# Patient Record
Sex: Female | Born: 1970 | Race: Black or African American | Hispanic: No | Marital: Single | Smoking: Never smoker
Health system: Southern US, Community
[De-identification: ages and names within clinical notes are randomized; demographics above are authoritative.]

## PROBLEM LIST (undated history)

## (undated) DIAGNOSIS — I10 Essential (primary) hypertension: Secondary | ICD-10-CM

## (undated) DIAGNOSIS — Z8489 Family history of other specified conditions: Secondary | ICD-10-CM

## (undated) DIAGNOSIS — E785 Hyperlipidemia, unspecified: Secondary | ICD-10-CM

## (undated) DIAGNOSIS — I471 Supraventricular tachycardia, unspecified: Secondary | ICD-10-CM

## (undated) DIAGNOSIS — R112 Nausea with vomiting, unspecified: Secondary | ICD-10-CM

## (undated) DIAGNOSIS — R51 Headache: Secondary | ICD-10-CM

## (undated) DIAGNOSIS — Z8619 Personal history of other infectious and parasitic diseases: Secondary | ICD-10-CM

## (undated) DIAGNOSIS — M2540 Effusion, unspecified joint: Secondary | ICD-10-CM

## (undated) DIAGNOSIS — J302 Other seasonal allergic rhinitis: Secondary | ICD-10-CM

## (undated) DIAGNOSIS — Z9889 Other specified postprocedural states: Secondary | ICD-10-CM

## (undated) HISTORY — PX: ANKLE SURGERY: SHX546

## (undated) HISTORY — PX: VAGINAL HYSTERECTOMY: SUR661

## (undated) HISTORY — PX: CHOLECYSTECTOMY: SHX55

## (undated) HISTORY — PX: TONSILLECTOMY: SUR1361

## (undated) HISTORY — PX: TUBAL LIGATION: SHX77

## (undated) HISTORY — PX: OTHER SURGICAL HISTORY: SHX169

## (undated) HISTORY — PX: NASAL SEPTUM SURGERY: SHX37

---

## 2008-02-01 ENCOUNTER — Emergency Department (HOSPITAL_BASED_OUTPATIENT_CLINIC_OR_DEPARTMENT_OTHER): Admission: EM | Admit: 2008-02-01 | Discharge: 2008-02-01 | Payer: Self-pay | Admitting: Emergency Medicine

## 2008-07-21 ENCOUNTER — Emergency Department (HOSPITAL_BASED_OUTPATIENT_CLINIC_OR_DEPARTMENT_OTHER): Admission: EM | Admit: 2008-07-21 | Discharge: 2008-07-21 | Payer: Self-pay | Admitting: Emergency Medicine

## 2008-08-11 ENCOUNTER — Emergency Department (HOSPITAL_BASED_OUTPATIENT_CLINIC_OR_DEPARTMENT_OTHER): Admission: EM | Admit: 2008-08-11 | Discharge: 2008-08-11 | Payer: Self-pay | Admitting: Emergency Medicine

## 2009-09-10 ENCOUNTER — Ambulatory Visit: Payer: Self-pay | Admitting: Diagnostic Radiology

## 2009-09-11 ENCOUNTER — Encounter (INDEPENDENT_AMBULATORY_CARE_PROVIDER_SITE_OTHER): Payer: Self-pay | Admitting: Internal Medicine

## 2009-09-11 ENCOUNTER — Inpatient Hospital Stay (HOSPITAL_COMMUNITY): Admission: EM | Admit: 2009-09-11 | Discharge: 2009-09-13 | Payer: Self-pay | Admitting: Internal Medicine

## 2009-09-11 ENCOUNTER — Ambulatory Visit: Payer: Self-pay | Admitting: Cardiovascular Disease

## 2009-09-11 ENCOUNTER — Ambulatory Visit: Payer: Self-pay | Admitting: Surgery

## 2009-09-11 LAB — CONVERTED CEMR LAB: TSH: 0.859 microintl units/mL

## 2009-10-03 ENCOUNTER — Encounter: Payer: Self-pay | Admitting: Physician Assistant

## 2009-10-03 ENCOUNTER — Ambulatory Visit: Payer: Self-pay | Admitting: Internal Medicine

## 2009-10-03 DIAGNOSIS — N390 Urinary tract infection, site not specified: Secondary | ICD-10-CM | POA: Insufficient documentation

## 2009-10-03 DIAGNOSIS — E739 Lactose intolerance, unspecified: Secondary | ICD-10-CM | POA: Insufficient documentation

## 2009-10-03 DIAGNOSIS — D649 Anemia, unspecified: Secondary | ICD-10-CM | POA: Insufficient documentation

## 2009-10-03 DIAGNOSIS — I10 Essential (primary) hypertension: Secondary | ICD-10-CM | POA: Insufficient documentation

## 2009-10-03 DIAGNOSIS — I471 Supraventricular tachycardia: Secondary | ICD-10-CM | POA: Insufficient documentation

## 2009-10-03 DIAGNOSIS — B029 Zoster without complications: Secondary | ICD-10-CM | POA: Insufficient documentation

## 2009-10-03 DIAGNOSIS — G4730 Sleep apnea, unspecified: Secondary | ICD-10-CM | POA: Insufficient documentation

## 2009-10-03 DIAGNOSIS — R42 Dizziness and giddiness: Secondary | ICD-10-CM | POA: Insufficient documentation

## 2009-10-03 DIAGNOSIS — E876 Hypokalemia: Secondary | ICD-10-CM | POA: Insufficient documentation

## 2009-10-03 DIAGNOSIS — Z8679 Personal history of other diseases of the circulatory system: Secondary | ICD-10-CM

## 2009-10-03 LAB — CONVERTED CEMR LAB
Glucose, Urine, Semiquant: NEGATIVE
Ketones, urine, test strip: NEGATIVE
Protein, U semiquant: 30
WBC Urine, dipstick: NEGATIVE

## 2009-10-04 DIAGNOSIS — E785 Hyperlipidemia, unspecified: Secondary | ICD-10-CM | POA: Insufficient documentation

## 2009-10-04 LAB — CONVERTED CEMR LAB
ALT: 18 units/L (ref 0–35)
Albumin: 4.5 g/dL (ref 3.5–5.2)
Basophils Absolute: 0 10*3/uL (ref 0.0–0.1)
CO2: 27 meq/L (ref 19–32)
Calcium: 9.5 mg/dL (ref 8.4–10.5)
Chloride: 101 meq/L (ref 96–112)
Cholesterol: 251 mg/dL — ABNORMAL HIGH (ref 0–200)
Hemoglobin: 12.6 g/dL (ref 12.0–15.0)
Lymphocytes Relative: 43 % (ref 12–46)
Monocytes Absolute: 0.3 10*3/uL (ref 0.1–1.0)
Neutro Abs: 3.3 10*3/uL (ref 1.7–7.7)
Platelets: 349 10*3/uL (ref 150–400)
RDW: 12.9 % (ref 11.5–15.5)
Sodium: 139 meq/L (ref 135–145)
Total Protein: 7.2 g/dL (ref 6.0–8.3)

## 2009-10-09 ENCOUNTER — Encounter (INDEPENDENT_AMBULATORY_CARE_PROVIDER_SITE_OTHER): Payer: Self-pay | Admitting: *Deleted

## 2010-02-04 NOTE — Letter (Signed)
Summary: *HSN Results Follow up  [facility]  [address] [place], [place] [address]   Phone: [phone]  Fax: [phone]      10/09/2009   Cassidy Sanchez [address] [place], [place]  [address]   Dear  Ms. Clifford Hebel,                            ____S.Drinkard,FNP   ____D. Gore,FNP       ____B. McPherson,MD   ____V. Rankins,MD    ____E. Mulberry,MD    ____N. Daphine Deutscher, FNP  ____D. Reche Dixon, MD    ____K. Philipp Deputy, MD    ____Other     This letter is to inform you that your recent test(s):  _______Pap Smear    ___X____Lab Test     _______X-ray    _______ is within acceptable limits  ___X____ requires a medication change  ____X___ requires a follow-up lab visit  _______ requires a follow-up visit with your provider   Comments:  We have been trying to reach you.  Please call the office at your earliest convenience.       _________________________________________________________ If you have any questions, please contact our office                     Sincerely,  Armenia Shannon [facility]

## 2010-02-04 NOTE — Assessment & Plan Note (Signed)
Summary: XFU--SYNCOPE//YC   Vital Signs:  Patient profile:   40 year old female Height:      63.5 inches Weight:      232 pounds BMI:     40.60 Temp:     97.8 degrees F oral Pulse rate:   76 / minute Pulse rhythm:   regular Resp:     18 per minute BP sitting:   140 / 86  (left arm) Cuff size:   regular  Vitals Entered By: [place] Shannon (October 03, 2009 11:52 AM) CC: xf/u.... pt says her left ear feels real funny and thats when she starts feeling dizzy.. pt says she has a cold   Does patient need assistance? Functional Status Self care Ambulation Normal   Primary Care Iria Jamerson:  Tereso Newcomer, PA-C  CC:  xf/u.... pt says her left ear feels real funny and thats when she starts feeling dizzy.. pt says she has a cold .  History of Present Illness: New pt.  Moved to [place] 2 years ago.  Previously followed by Dr. Lerry Liner.  She was just admitted to the hospital with episodes of near syncope.  She was diagnosed with urinary tract infection.  She had a reported history of supraventricular tachycardia.  She was evaluated by cardiology.  An echocardiogram demonstrated normal LV function.  Chest CT was negative.  Head CT was also negative.  An outpatient event monitor was recommended.  Of note, her orthostatic vital signs were normal.  She states that she had 2 episodes of near syncope when going from sitting to standing.  She felt some rapid heart rates with this.  She denies any frank syncope.  She's had some episodes of chest discomfort and shortness of breath.  No further workup was felt necessary by cardiology.  She denies exertional chest pain or shortness of breath.  She denies any spinning sensation.  She can sometimes make her somewhat dizzy with turning over in bed at night.  She said couple of episodes of lightheadedness since being discharged from hospital.  She's not had symptoms like what she had prior to being admitted to the hospital.  She also developed some blisters on  the tip of her nose about 2 days ago.  It is somewhat painful.  She says that she first had a cold sore.  She's never had shingles.  Habits & Providers  Alcohol-Tobacco-Diet     Tobacco Status: never  Exercise-Depression-Behavior     Drug Use: no  Current Medications (verified): 1)  None  Allergies (verified): No Known Drug Allergies  Past History:  Past Medical History: Admx for syncope/near syncope 09/2009    a.  Echo 09/11/2009:  EF 55-60%    b.  Chest CT neg for PE    c.  EEG normal Glucose Intolerance (A1C 6.2 in 09/2009) Hypertension Sleep Apnea - s/p surgery (no cpap) h/o Parox. SVT   a.  prior PCP placed on meds for rapid heartbeat in past; no formal workup   b.  2 week event monitor completed 10/02/2009 with [place]  Past Surgical History: Hysterectomy (TAH only) Left Breast Lumpectomy Tubal ligation s/p uvulectomy s/p sinus surgery  Family History: Lung CA - dad Breast CA - PGM  Social History: Occupation: Clinical biochemist Married- separated 2 kids Never Smoked Alcohol use-no Drug use-no Occupation:  employed Smoking Status:  never Drug Use:  no  Physical Exam  General:  alert, well-developed, and well-nourished.   Head:  normocephalic and atraumatic.   Eyes:  pupils equal, pupils round, and pupils reactive to light.   Ears:  R ear normal and L ear normal.   Mouth:  pharynx pink and moist.   Neck:  supple.   Lungs:  normal breath sounds.   Heart:  normal rate, regular rhythm, and no murmur.   Abdomen:  soft, non-tender, and no hepatomegaly.   Neurologic:  alert & oriented X3, cranial nerves II-XII intact, strength normal in all extremities, DTRs symmetrical and normal, and Romberg negative.   No dizziness elicited with side lying bilat Skin:  large patch of excoriated vesicle tip of nose Psych:  normally interactive.     Impression & Recommendations:  Problem # 1:  ANEMIA (ICD-285.9)  repeat cbc  Orders: T-CBC w/Diff  (04540-98119)  Problem # 2:  HYPOKALEMIA (ICD-276.8)  noted in hosp 09/2009 get repeat cmet  Orders: T-Comprehensive Metabolic Panel (14782-95621)  Problem # 3:  DIZZINESS (ICD-780.4) extensive w/u near syncope happened in context of UTI ? related to volume depletion  event monitor pending  ? sinus congestion contributing to lightheadedness trial of nasal steroid consider vestib rehab if symptoms continue (although no classic symptoms of vertigo)  Problem # 4:  HERPES ZOSTER (ICD-053.9) on tip of nose rec tx as she is within 48 hours of starting  Problem # 5:  GLUCOSE INTOLERANCE (ICD-271.3)  refer to Susie Piper  Orders: T-Lipid Profile (30865-78469)  Problem # 6:  UTI (ICD-599.0) repeat u/a to make sure recovered  Orders: T-Urinalysis (62952-84132)  Problem # 7:  SUPRAVENTRICULAR TACHYCARDIA, HX OF (ICD-V12.59) monitor pending  Problem # 8:  Preventive Health Care (ICD-V70.0)  due for pap in 1-2 months get records  Orders: T-Lipid Profile (44010-27253)  Complete Medication List: 1)  Metoprolol Tartrate 50 Mg Tabs (Metoprolol tartrate) .... Take 1 tablet by mouth two times a day 2)  Hydrochlorothiazide 25 Mg Tabs (Hydrochlorothiazide) .... Take 1 tablet by mouth once a day 3)  Amitriptyline Hcl 75 Mg Tabs (Amitriptyline hcl) .... Take 1 tab by mouth at bedtime as needed for sleep 4)  Nasacort Aq 55 Mcg/act Aers (Triamcinolone acetonide) .... 2 sprays each nostril once daily 5)  Valtrex 1 Gm Tabs (Valacyclovir hcl) .... Take 1 tablet by mouth three times a day for 7 days  Patient Instructions: 1)  Need eligibility appt. 2)  Refer to [employer] for diet (glucose intolerance). 3)  Sign form to get records from prior PCP (Dr. Mayford Knife). 4)  Use Nasacort once daily for one month.  You can use as needed after that. 5)  Take Valtrex until all gone. 6)  If your lesions on your nose are not getting better or are getting worse in the next week, you need to follow  up. 7)  Otherwise, schedule an appt in 2 months for CPP. Prescriptions: VALTREX 1 GM TABS (VALACYCLOVIR HCL) Take 1 tablet by mouth three times a day for 7 days  #21 x 0   Entered and Authorized by:   Tereso Newcomer PA-C   Signed by:   Tereso Newcomer PA-C on 10/03/2009   Method used:   Printed then faxed to ...         RxID:   [id number] NASACORT AQ 55 MCG/ACT AERS (TRIAMCINOLONE ACETONIDE) 2 sprays each nostril once daily  #1 x 2   Entered and Authorized by:   Tereso Newcomer PA-C   Signed by:   Tereso Newcomer PA-C on 10/03/2009   Method used:   Printed then faxed to .[facility] Kitchen[facility]  RxID:   [id number]    CT of Chest  Procedure date:  09/12/2009  Findings:      IMPRESSION:   Negative.  No evidence of pulmonary embolism or other acute   findings.  CT Brain  Procedure date:  09/10/2009  Findings:       Findings: The brain appears normal without evidence of acute   infarction, hemorrhage, mass lesion, mass effect, midline shift or   abnormal extra-axial fluid collection.  No hydrocephalus.   Calvarium intact.    IMPRESSION:   Normal head CT.  Laboratory Results   Urine Tests  Date/Time Received: October 03, 2009 Date/Time Reported: 3:00 PM  Routine Urinalysis   Color: yellow Appearance: Clear Glucose: negative   (Normal Range: Negative) Bilirubin: negative   (Normal Range: Negative) Ketone: negative   (Normal Range: Negative) Spec. Gravity: >=1.030   (Normal Range: 1.003-1.035) Blood: negative   (Normal Range: Negative) pH: 6.0   (Normal Range: 5.0-8.0) Protein: 30   (Normal Range: Negative) Urobilinogen: 0.2   (Normal Range: 0-1) Nitrite: negative   (Normal Range: Negative) Leukocyte Esterace: negative   (Normal Range: Negative)

## 2010-03-20 LAB — TSH: TSH: 0.859 u[IU]/mL (ref 0.350–4.500)

## 2010-03-20 LAB — URINE CULTURE
Colony Count: 30000
Culture  Setup Time: 201109070422

## 2010-03-20 LAB — COMPREHENSIVE METABOLIC PANEL
AST: 17 U/L (ref 0–37)
Albumin: 3.3 g/dL — ABNORMAL LOW (ref 3.5–5.2)
Alkaline Phosphatase: 51 U/L (ref 39–117)
Alkaline Phosphatase: 34 U/L — ABNORMAL LOW (ref 39–117)
BUN: 18 mg/dL (ref 6–23)
BUN: 9 mg/dL (ref 6–23)
CO2: 29 mEq/L (ref 19–32)
Chloride: 101 mEq/L (ref 96–112)
Creatinine, Ser: 0.8 mg/dL (ref 0.4–1.2)
GFR calc non Af Amer: >60 mL/min (ref >60)
Potassium: 3.2 mEq/L — ABNORMAL LOW (ref 3.5–5.1)
Total Bilirubin: 0.5 mg/dL (ref 0.3–1.2)
Total Protein: 6.1 g/dL (ref 6.0–8.3)

## 2010-03-20 LAB — CARDIAC PANEL(CRET KIN+CKTOT+MB+TROPI)
CK, MB: 1.2 ng/mL (ref 0.3–4.0)
Relative Index: 1 (ref 0.0–2.5)
Relative Index: 1.2 (ref 0.0–2.5)
Total CK: 102 U/L (ref 7–177)
Troponin I: 0.01 ng/mL (ref 0.00–0.06)

## 2010-03-20 LAB — URINALYSIS, ROUTINE W REFLEX MICROSCOPIC
Glucose, UA: NEGATIVE mg/dL
Specific Gravity, Urine: 1.029 (ref 1.005–1.030)
pH: 6 (ref 5.0–8.0)

## 2010-03-20 LAB — BASIC METABOLIC PANEL
BUN: 8 mg/dL (ref 6–23)
CO2: 26 mEq/L (ref 19–32)
Chloride: 106 mEq/L (ref 96–112)
Creatinine, Ser: 0.64 mg/dL (ref 0.4–1.2)
GFR calc non Af Amer: >60 mL/min (ref >60)
Potassium: 3.4 mEq/L — ABNORMAL LOW (ref 3.5–5.1)
Sodium: 138 mEq/L (ref 135–145)

## 2010-03-20 LAB — CBC
Hemoglobin: 12.5 g/dL (ref 12.0–15.0)
MCH: 32.1 pg (ref 26.0–34.0)
MCV: 90.9 fL (ref 78.0–100.0)
MCV: 89.9 fL (ref 78.0–100.0)
Platelets: 312 10*3/uL (ref 150–400)
RBC: 3.9 MIL/uL (ref 3.87–5.11)
RDW: 12.3 % (ref 11.5–15.5)
WBC: 7.2 10*3/uL (ref 4.0–10.5)

## 2010-03-20 LAB — DIFFERENTIAL
Eosinophils Absolute: 0.1 10*3/uL (ref 0.0–0.7)
Eosinophils Relative: 1 % (ref 0–5)
Lymphs Abs: 4 10*3/uL (ref 0.7–4.0)
Monocytes Relative: 7 % (ref 3–12)

## 2010-03-20 LAB — HEMOGLOBIN A1C: Mean Plasma Glucose: 131 mg/dL — ABNORMAL HIGH (ref <117)

## 2010-03-20 LAB — POCT CARDIAC MARKERS

## 2010-03-20 LAB — MAGNESIUM: Magnesium: 1.9 mg/dL (ref 1.5–2.5)

## 2010-04-12 ENCOUNTER — Emergency Department (HOSPITAL_BASED_OUTPATIENT_CLINIC_OR_DEPARTMENT_OTHER)
Admission: EM | Admit: 2010-04-12 | Discharge: 2010-04-12 | Disposition: A | Discharge status: Home / Self Care | Payer: Self-pay | Attending: Emergency Medicine | Admitting: Emergency Medicine

## 2010-04-12 DIAGNOSIS — L089 Local infection of the skin and subcutaneous tissue, unspecified: Secondary | ICD-10-CM | POA: Insufficient documentation

## 2010-04-12 DIAGNOSIS — M79609 Pain in unspecified limb: Secondary | ICD-10-CM | POA: Insufficient documentation

## 2010-04-12 DIAGNOSIS — I10 Essential (primary) hypertension: Secondary | ICD-10-CM | POA: Insufficient documentation

## 2010-04-12 DIAGNOSIS — E785 Hyperlipidemia, unspecified: Secondary | ICD-10-CM | POA: Insufficient documentation

## 2010-12-16 ENCOUNTER — Emergency Department (INDEPENDENT_AMBULATORY_CARE_PROVIDER_SITE_OTHER)

## 2010-12-16 ENCOUNTER — Emergency Department (HOSPITAL_BASED_OUTPATIENT_CLINIC_OR_DEPARTMENT_OTHER)
Admission: EM | Admit: 2010-12-16 | Discharge: 2010-12-16 | Disposition: A | Discharge status: Home / Self Care | Attending: Emergency Medicine | Admitting: Emergency Medicine

## 2010-12-16 ENCOUNTER — Encounter: Payer: Self-pay | Admitting: *Deleted

## 2010-12-16 DIAGNOSIS — S9000XA Contusion of unspecified ankle, initial encounter: Secondary | ICD-10-CM | POA: Insufficient documentation

## 2010-12-16 DIAGNOSIS — M79609 Pain in unspecified limb: Secondary | ICD-10-CM

## 2010-12-16 DIAGNOSIS — S9001XA Contusion of right ankle, initial encounter: Secondary | ICD-10-CM

## 2010-12-16 DIAGNOSIS — W208XXA Other cause of strike by thrown, projected or falling object, initial encounter: Secondary | ICD-10-CM | POA: Insufficient documentation

## 2010-12-16 DIAGNOSIS — X58XXXA Exposure to other specified factors, initial encounter: Secondary | ICD-10-CM

## 2010-12-16 DIAGNOSIS — R609 Edema, unspecified: Secondary | ICD-10-CM | POA: Insufficient documentation

## 2010-12-16 HISTORY — DX: Supraventricular tachycardia, unspecified: I47.10

## 2010-12-16 HISTORY — DX: Supraventricular tachycardia: I47.1

## 2010-12-16 MED ORDER — OXYCODONE-ACETAMINOPHEN 5-325 MG PO TABS
1.0000 | ORAL_TABLET | Freq: Once | ORAL | Status: AC
  Administered 2010-12-16: 1 via ORAL
  Filled 2010-12-16: qty 1

## 2010-12-16 MED ORDER — NAPROXEN 500 MG PO TABS: 500.0000 mg | ORAL_TABLET | Freq: Two times a day (BID) | ORAL | Status: DC

## 2010-12-16 NOTE — ED Notes (Signed)
C/o right foot pain after pallet fell on it at work, +PMS

## 2010-12-16 NOTE — ED Provider Notes (Signed)
History     CSN: [id number] Arrival date & time: 12/16/2010  9:35 PM   First MD Initiated Contact with Patient 12/16/10 2231      Chief Complaint  Patient presents with  . Foot Injury    (Consider location/radiation/quality/duration/timing/severity/associated sxs/prior treatment) HPI Patient was at work today when she had a pallet fall on her right foot. Patient complains of pain in the right ankle area.  The pain increases with movement and palpation. She denies any other injuries. The pain is sharp and moderate. Past Medical History  Diagnosis Date  . SVT (supraventricular tachycardia)     Past Surgical History  Procedure Date  . Tubal ligation     No family history on file.  History  Substance Use Topics  . Smoking status: Not on file  . Smokeless tobacco: Not on file  . Alcohol Use: No    OB History    Grav Para Term Preterm Abortions TAB SAB Ect Mult Living                  Review of Systems  All other systems reviewed and are negative.    Allergies  Sulfa antibiotics  Home Medications   Current Outpatient Rx  Name Route Sig Dispense Refill  . HYDROCHLOROTHIAZIDE 25 MG PO TABS Oral Take 25 mg by mouth daily.      Marland Kitchen METOPROLOL TARTRATE 50 MG PO TABS Oral Take 50 mg by mouth 2 (two) times daily.      Marland Kitchen ONE-DAILY MULTI VITAMINS PO TABS Oral Take 1 tablet by mouth daily.        BP 179/97  Pulse 96  Temp(Src) 98.7 F (37.1 C) (Oral)  Resp 18  SpO2 100%  Physical Exam  Nursing note and vitals reviewed. Constitutional: She appears well-developed and well-nourished. No distress.  HENT:  Head: Normocephalic and atraumatic.  Right Ear: External ear normal.  Left Ear: External ear normal.  Eyes: Conjunctivae are normal. Right eye exhibits no discharge. Left eye exhibits no discharge. No scleral icterus.  Neck: Neck supple. No tracheal deviation present.  Cardiovascular: Normal rate.   Pulmonary/Chest: Effort normal. No stridor. No respiratory  distress.  Musculoskeletal: She exhibits edema and tenderness.       Right ankle: She exhibits swelling and ecchymosis. She exhibits no deformity and normal pulse. tenderness. Achilles tendon normal.  Neurological: She is alert. Cranial nerve deficit: no gross deficits.  Skin: Skin is warm and dry. No rash noted.  Psychiatric: She has a normal mood and affect.    ED Course  Procedures (including critical care time)  Labs Reviewed - No data to display Dg Foot Complete Right  12/16/2010  *RADIOLOGY REPORT*  Clinical Data: Anterior foot pain.  RIGHT FOOT COMPLETE - 3+ VIEW  Comparison: None.  Findings: No acute bony abnormality.  Specifically, no fracture, subluxation, or dislocation.  Soft tissues are intact.  IMPRESSION: No acute bony abnormality.  Original Report Authenticated By: Cyndie Chime, M.D.     Diagnosis: Contusion of right ankle   MDM  Patient without signs of fracture. SHe was placed in a splint and given crutches.        Celene Kras, MD 12/16/10 [phone]

## 2011-01-09 ENCOUNTER — Ambulatory Visit: Payer: Worker's Compensation

## 2011-01-09 ENCOUNTER — Other Ambulatory Visit: Payer: Self-pay | Admitting: Occupational Medicine

## 2011-01-09 DIAGNOSIS — R52 Pain, unspecified: Secondary | ICD-10-CM

## 2011-02-20 ENCOUNTER — Other Ambulatory Visit: Payer: Self-pay | Admitting: Orthopedic Surgery

## 2011-02-20 DIAGNOSIS — M79671 Pain in right foot: Secondary | ICD-10-CM

## 2011-02-20 DIAGNOSIS — M25571 Pain in right ankle and joints of right foot: Secondary | ICD-10-CM

## 2011-02-23 ENCOUNTER — Other Ambulatory Visit: Payer: Self-pay

## 2011-02-25 ENCOUNTER — Ambulatory Visit
Admission: RE | Admit: 2011-02-25 | Discharge: 2011-02-25 | Disposition: A | Discharge status: Home / Self Care | Payer: Worker's Compensation | Source: Ambulatory Visit | Attending: Orthopedic Surgery | Admitting: Orthopedic Surgery

## 2011-02-25 DIAGNOSIS — M25571 Pain in right ankle and joints of right foot: Secondary | ICD-10-CM

## 2011-02-25 DIAGNOSIS — M79671 Pain in right foot: Secondary | ICD-10-CM

## 2011-03-17 ENCOUNTER — Encounter (HOSPITAL_COMMUNITY): Payer: Self-pay

## 2011-03-17 ENCOUNTER — Emergency Department (HOSPITAL_COMMUNITY)
Admission: EM | Admit: 2011-03-17 | Discharge: 2011-03-17 | Disposition: A | Discharge status: Home Health | Payer: Self-pay | Attending: Emergency Medicine | Admitting: Emergency Medicine

## 2011-03-17 DIAGNOSIS — I10 Essential (primary) hypertension: Secondary | ICD-10-CM | POA: Insufficient documentation

## 2011-03-17 DIAGNOSIS — R197 Diarrhea, unspecified: Secondary | ICD-10-CM | POA: Insufficient documentation

## 2011-03-17 DIAGNOSIS — R109 Unspecified abdominal pain: Secondary | ICD-10-CM | POA: Insufficient documentation

## 2011-03-17 DIAGNOSIS — K529 Noninfective gastroenteritis and colitis, unspecified: Secondary | ICD-10-CM

## 2011-03-17 DIAGNOSIS — R112 Nausea with vomiting, unspecified: Secondary | ICD-10-CM | POA: Insufficient documentation

## 2011-03-17 DIAGNOSIS — K5289 Other specified noninfective gastroenteritis and colitis: Secondary | ICD-10-CM | POA: Insufficient documentation

## 2011-03-17 DIAGNOSIS — R6883 Chills (without fever): Secondary | ICD-10-CM | POA: Insufficient documentation

## 2011-03-17 HISTORY — DX: Essential (primary) hypertension: I10

## 2011-03-17 MED ORDER — GI COCKTAIL ~~LOC~~
30.0000 mL | Freq: Once | ORAL | Status: AC
  Administered 2011-03-17: 30 mL via ORAL
  Filled 2011-03-17: qty 30

## 2011-03-17 MED ORDER — ONDANSETRON HCL 4 MG PO TABS: 4.0000 mg | ORAL_TABLET | Freq: Four times a day (QID) | ORAL | Status: AC

## 2011-03-17 MED ORDER — MORPHINE SULFATE 4 MG/ML IJ SOLN
4.0000 mg | Freq: Once | INTRAMUSCULAR | Status: AC
  Administered 2011-03-17: 4 mg via INTRAVENOUS
  Filled 2011-03-17: qty 1

## 2011-03-17 MED ORDER — ONDANSETRON HCL 4 MG/2ML IJ SOLN
4.0000 mg | Freq: Once | INTRAMUSCULAR | Status: AC
  Administered 2011-03-17: 4 mg via INTRAVENOUS
  Filled 2011-03-17: qty 2

## 2011-03-17 MED ORDER — SODIUM CHLORIDE 0.9 % IV BOLUS (SEPSIS)
1000.0000 mL | Freq: Once | INTRAVENOUS | Status: AC
  Administered 2011-03-17: 1000 mL via INTRAVENOUS

## 2011-03-17 MED ORDER — ONDANSETRON HCL 4 MG/2ML IJ SOLN
INTRAMUSCULAR | Status: AC
  Administered 2011-03-17: 10:00:00
  Filled 2011-03-17: qty 2

## 2011-03-17 MED ORDER — LORAZEPAM 2 MG/ML IJ SOLN
1.0000 mg | Freq: Once | INTRAMUSCULAR | Status: AC
  Administered 2011-03-17: 1 mg via INTRAVENOUS
  Filled 2011-03-17: qty 1

## 2011-03-17 NOTE — ED Notes (Signed)
EMS reports pt with nausea and vomiting and diarrhea since 4am, IV zofran given 500 cc NS thru 18 GU IV

## 2011-03-17 NOTE — ED Provider Notes (Signed)
Medical screening examination/treatment/procedure(s) were performed by non-physician practitioner and as supervising physician I was immediately available for consultation/collaboration. Devoria Albe, MD, Armando Gang   Ward Givens, MD 03/17/11 [phone]

## 2011-03-17 NOTE — ED Notes (Signed)
Report received and care assumed from Nolon Lennert., RN

## 2011-03-17 NOTE — Discharge Instructions (Signed)
Continue to stay well hydrated with small sips of fluid throughout the day. Use Zofran as directed, as needed for nausea. Follow a BRAT diet for next 24-48 hours. Followup with Dr. Christianne Borrow in the next 2-3 days for recheck of ongoing symptoms but returned to emergency department for changing or worsening of symptoms.  Viral Gastroenteritis Viral gastroenteritis is also known as stomach flu. This condition affects the stomach and intestinal tract. It can cause sudden diarrhea and vomiting. The illness typically lasts 3 to 8 days. Most people develop an immune response that eventually gets rid of the virus. While this natural response develops, the virus can make you quite ill. CAUSES  Many different viruses can cause gastroenteritis, such as rotavirus or noroviruses. You can catch one of these viruses by consuming contaminated food or water. You may also catch a virus by sharing utensils or other personal items with an infected person or by touching a contaminated surface. SYMPTOMS  The most common symptoms are diarrhea and vomiting. These problems can cause a severe loss of body fluids (dehydration) and a body salt (electrolyte) imbalance. Other symptoms may include:  Fever.   Headache.   Fatigue.   Abdominal pain.  DIAGNOSIS  Your caregiver can usually diagnose viral gastroenteritis based on your symptoms and a physical exam. A stool sample may also be taken to test for the presence of viruses or other infections. TREATMENT  This illness typically goes away on its own. Treatments are aimed at rehydration. The most serious cases of viral gastroenteritis involve vomiting so severely that you are not able to keep fluids down. In these cases, fluids must be given through an intravenous line (IV). HOME CARE INSTRUCTIONS   Drink enough fluids to keep your urine clear or pale yellow. Drink small amounts of fluids frequently and increase the amounts as tolerated.   Ask your caregiver for specific  rehydration instructions.   Avoid:   Foods high in sugar.   Alcohol.   Carbonated drinks.   Tobacco.   Juice.   Caffeine drinks.   Extremely hot or cold fluids.   Fatty, greasy foods.   Too much intake of anything at one time.   Dairy products until 24 to 48 hours after diarrhea stops.   You may consume probiotics. Probiotics are active cultures of beneficial bacteria. They may lessen the amount and number of diarrheal stools in adults. Probiotics can be found in yogurt with active cultures and in supplements.   Wash your hands well to avoid spreading the virus.   Only take over-the-counter or prescription medicines for pain, discomfort, or fever as directed by your caregiver. Do not give aspirin to children. Antidiarrheal medicines are not recommended.   Ask your caregiver if you should continue to take your regular prescribed and over-the-counter medicines.   Keep all follow-up appointments as directed by your caregiver.  SEEK IMMEDIATE MEDICAL CARE IF:   You are unable to keep fluids down.   You do not urinate at least once every 6 to 8 hours.   You develop shortness of breath.   You notice blood in your stool or vomit. This may look like coffee grounds.   You have abdominal pain that increases or is concentrated in one small area (localized).   You have persistent vomiting or diarrhea.   You have a fever.   The patient is a child younger than 3 months, and he or she has a fever.   The patient is a child older than 3  months, and he or she has a fever and persistent symptoms.   The patient is a child older than 3 months, and he or she has a fever and symptoms suddenly get worse.   The patient is a baby, and he or she has no tears when crying.  MAKE SURE YOU:   Understand these instructions.   Will watch your condition.   Will get help right away if you are not doing well or get worse.  Document Released: 12/22/2004 Document Revised: 12/11/2010 Document  Reviewed: 10/08/2010 [facility] Patient Information 2012 [place], [place].  B.R.A.T. Diet Your doctor has recommended the B.R.A.T. diet for you or your child until the condition improves. This is often used to help control diarrhea and vomiting symptoms. If you or your child can tolerate clear liquids, you may have:  Bananas.   Rice.   Applesauce.   Toast (and other simple starches such as crackers, potatoes, noodles).  Be sure to avoid dairy products, meats, and fatty foods until symptoms are better. Fruit juices such as apple, grape, and prune juice can make diarrhea worse. Avoid these. Continue this diet for 2 days or as instructed by your caregiver. Document Released: 12/22/2004 Document Revised: 12/11/2010 Document Reviewed: 06/10/2006 [facility] Patient Information 2012 [place], [place]. B.R.A.T. Diet Your doctor has recommended the B.R.A.T. diet for you or your child until the condition improves. This is often used to help control diarrhea and vomiting symptoms. If you or your child can tolerate clear liquids, you may have:  Bananas.   Rice.   Applesauce.   Toast (and other simple starches such as crackers, potatoes, noodles).  Be sure to avoid dairy products, meats, and fatty foods until symptoms are better. Fruit juices such as apple, grape, and prune juice can make diarrhea worse. Avoid these. Continue this diet for 2 days or as instructed by your caregiver. Document Released: 12/22/2004 Document Revised: 12/11/2010 Document Reviewed: 06/10/2006 [facility] Patient Information 2012 [place], [place].

## 2011-03-17 NOTE — ED Notes (Signed)
Pt denies abdominal pain at this time.  She does report some mild nausea without emesis that she says was worsened by the GI cocktail.  She does c/o back pain at 9/10.  Dr. Lynelle Doctor notified.

## 2011-03-17 NOTE — ED Provider Notes (Signed)
History     CSN: [id number]  Arrival date & time 03/17/11  4098   First MD Initiated Contact with Patient 03/17/11 [phone]      Chief Complaint  Patient presents with  . Emesis    (Consider location/radiation/quality/duration/timing/severity/associated sxs/prior treatment) HPI  Presents to emergency department complaining of acute onset nausea, vomiting, diarrhea and chills. Patient states that 4 AM she woke from her  sleep with acute onset nausea and vomiting that was followed by diarrhea. Patient states that since onset of symptoms she's had persistent vomiting and loose stools times multiple episodes. Patient states she's not been able to tolerate fluids by mouth due to ongoing vomiting. Patient states she's felt chilled and feverish but did not have known recorded fever. The patient denies any recent sick contacts. Patient states that she has abdominal cramping with the vomiting and diarrhea but denies any point specific abdominal pain. She denies aggravating or alleviating factors. It is known fevers, headache, chest pain, shortness breath, point specific abdominal pain, dysuria, hematuria, or blood in her stool.  Past Medical History  Diagnosis Date  . SVT (supraventricular tachycardia)   . Hypertension     Past Surgical History  Procedure Date  . Tubal ligation   . Cesarean section   . Abdominal hysterectomy     History reviewed. No pertinent family history.  History  Substance Use Topics  . Smoking status: Not on file  . Smokeless tobacco: Not on file  . Alcohol Use: No    OB History    Grav Para Term Preterm Abortions TAB SAB Ect Mult Living   2 2        2       Review of Systems  All other systems reviewed and are negative.    Allergies  Latex and Sulfa antibiotics  Home Medications   Current Outpatient Rx  Name Route Sig Dispense Refill  . HYDROCHLOROTHIAZIDE 25 MG PO TABS Oral Take 25 mg by mouth every evening.     Marland Kitchen METOPROLOL TARTRATE 50 MG PO  TABS Oral Take 50 mg by mouth 2 (two) times daily.        BP 150/86  Pulse 113  Temp(Src) 99.5 F (37.5 C) (Oral)  Resp 22  SpO2 95%  Physical Exam  Nursing note and vitals reviewed. Constitutional: She is oriented to person, place, and time. She appears well-developed and well-nourished. No distress.  HENT:  Head: Normocephalic and atraumatic.  Eyes: Conjunctivae are normal.  Neck: Normal range of motion. Neck supple.  Cardiovascular: Regular rhythm, normal heart sounds and intact distal pulses.  Tachycardia present.  Exam reveals no gallop and no friction rub.   No murmur heard. Pulmonary/Chest: Effort normal and breath sounds normal. No respiratory distress. She has no wheezes. She has no rales. She exhibits no tenderness.  Abdominal: Soft. Bowel sounds are normal. She exhibits no distension and no mass. There is no tenderness. There is no rebound and no guarding.  Musculoskeletal: Normal range of motion. She exhibits no edema and no tenderness.  Neurological: She is alert and oriented to person, place, and time.  Skin: Skin is warm and dry. No rash noted. She is not diaphoretic. No erythema.  Psychiatric: She has a normal mood and affect.    ED Course  Procedures (including critical care time)  IV fluids, IV zofran. PO GI cocktail.   Patient is tolerating fluids well in ER, drinking sprite and eating saltine crackers without vomiting or difficulty.   Labs Reviewed -  No data to display No results found.   1. Gastroenteritis       MDM  N/v/d with mild abdominal cramping but no point specific abdominal pain with abdomen soft no peritoneal signs consistent with a gastroenteritis at this time. Patient has no signs or symptoms of small bowel traction or other acute process in abdomen. Patient afebrile nontoxic-appearing. She is tolerating fluids well in ER. Patient is agreeable to following up with her primary care physician next few days for recheck of ongoing mild symptoms  but to return to emergency department for changing or worsening symptoms.        Jenness Corner, [place] 03/17/11 1246

## 2011-03-17 NOTE — ED Notes (Addendum)
Pt tolerated sprite and crackers well.  No c/o N/V/D at this time.

## 2013-02-16 ENCOUNTER — Emergency Department (HOSPITAL_BASED_OUTPATIENT_CLINIC_OR_DEPARTMENT_OTHER): Payer: No Typology Code available for payment source

## 2013-02-16 ENCOUNTER — Emergency Department (HOSPITAL_BASED_OUTPATIENT_CLINIC_OR_DEPARTMENT_OTHER)
Admission: EM | Admit: 2013-02-16 | Discharge: 2013-02-17 | Disposition: A | Discharge status: Home / Self Care | Payer: No Typology Code available for payment source | Attending: Emergency Medicine | Admitting: Emergency Medicine

## 2013-02-16 ENCOUNTER — Encounter (HOSPITAL_BASED_OUTPATIENT_CLINIC_OR_DEPARTMENT_OTHER): Payer: Self-pay | Admitting: Emergency Medicine

## 2013-02-16 DIAGNOSIS — I10 Essential (primary) hypertension: Secondary | ICD-10-CM | POA: Insufficient documentation

## 2013-02-16 DIAGNOSIS — Z91040 Latex allergy status: Secondary | ICD-10-CM | POA: Insufficient documentation

## 2013-02-16 DIAGNOSIS — Z3202 Encounter for pregnancy test, result negative: Secondary | ICD-10-CM | POA: Insufficient documentation

## 2013-02-16 DIAGNOSIS — Z79899 Other long term (current) drug therapy: Secondary | ICD-10-CM | POA: Insufficient documentation

## 2013-02-16 DIAGNOSIS — Z9851 Tubal ligation status: Secondary | ICD-10-CM | POA: Insufficient documentation

## 2013-02-16 DIAGNOSIS — Z90710 Acquired absence of both cervix and uterus: Secondary | ICD-10-CM | POA: Insufficient documentation

## 2013-02-16 DIAGNOSIS — R110 Nausea: Secondary | ICD-10-CM | POA: Insufficient documentation

## 2013-02-16 DIAGNOSIS — R1011 Right upper quadrant pain: Secondary | ICD-10-CM | POA: Insufficient documentation

## 2013-02-16 LAB — COMPREHENSIVE METABOLIC PANEL
ALT: 15 U/L (ref 0–35)
AST: 14 U/L (ref 0–37)
Albumin: 4.2 g/dL (ref 3.5–5.2)
Alkaline Phosphatase: 75 U/L (ref 39–117)
BUN: 13 mg/dL (ref 6–23)
CHLORIDE: 94 meq/L — AB (ref 96–112)
CO2: 30 meq/L (ref 19–32)
CREATININE: 0.6 mg/dL (ref 0.50–1.10)
Calcium: 10 mg/dL (ref 8.4–10.5)
GLUCOSE: 106 mg/dL — AB (ref 70–99)
Potassium: 3.7 mEq/L (ref 3.7–5.3)
Sodium: 137 mEq/L (ref 137–147)
Total Bilirubin: 0.3 mg/dL (ref 0.3–1.2)
Total Protein: 8.3 g/dL (ref 6.0–8.3)

## 2013-02-16 LAB — CBC WITH DIFFERENTIAL/PLATELET
BASOS ABS: 0 10*3/uL (ref 0.0–0.1)
Basophils Relative: 0 % (ref 0–1)
Eosinophils Absolute: 0.2 10*3/uL (ref 0.0–0.7)
Eosinophils Relative: 2 % (ref 0–5)
HEMATOCRIT: 42.1 % (ref 36.0–46.0)
HEMOGLOBIN: 14.1 g/dL (ref 12.0–15.0)
LYMPHS ABS: 4.4 10*3/uL — AB (ref 0.7–4.0)
LYMPHS PCT: 46 % (ref 12–46)
MCH: 29.6 pg (ref 26.0–34.0)
MCHC: 33.5 g/dL (ref 30.0–36.0)
MCV: 88.4 fL (ref 78.0–100.0)
MONO ABS: 0.6 10*3/uL (ref 0.1–1.0)
Monocytes Relative: 6 % (ref 3–12)
NEUTROS ABS: 4.5 10*3/uL (ref 1.7–7.7)
Neutrophils Relative %: 46 % (ref 43–77)
Platelets: 374 10*3/uL (ref 150–400)
RBC: 4.76 MIL/uL (ref 3.87–5.11)
RDW: 12 % (ref 11.5–15.5)
WBC: 9.7 10*3/uL (ref 4.0–10.5)

## 2013-02-16 LAB — URINALYSIS, ROUTINE W REFLEX MICROSCOPIC
Bilirubin Urine: NEGATIVE
Glucose, UA: NEGATIVE mg/dL
Hgb urine dipstick: NEGATIVE
Ketones, ur: NEGATIVE mg/dL
Leukocytes, UA: NEGATIVE
Nitrite: NEGATIVE
Protein, ur: NEGATIVE mg/dL
Specific Gravity, Urine: 1.016 (ref 1.005–1.030)
Urobilinogen, UA: 0.2 mg/dL (ref 0.0–1.0)
pH: 7.5 (ref 5.0–8.0)

## 2013-02-16 LAB — PREGNANCY, URINE: PREG TEST UR: NEGATIVE

## 2013-02-16 LAB — LIPASE, BLOOD: Lipase: 19 U/L (ref 11–59)

## 2013-02-16 MED ORDER — MORPHINE SULFATE 4 MG/ML IJ SOLN
4.0000 mg | Freq: Once | INTRAMUSCULAR | Status: AC
  Administered 2013-02-16: 4 mg via INTRAVENOUS
  Filled 2013-02-16: qty 1

## 2013-02-16 MED ORDER — ONDANSETRON HCL 4 MG PO TABS: 4.0000 mg | ORAL_TABLET | Freq: Four times a day (QID) | ORAL | Status: DC

## 2013-02-16 MED ORDER — HYDROCODONE-ACETAMINOPHEN 5-325 MG PO TABS: 2.0000 | ORAL_TABLET | ORAL | Status: DC | PRN

## 2013-02-16 MED ORDER — IOHEXOL 300 MG/ML  SOLN
100.0000 mL | Freq: Once | INTRAMUSCULAR | Status: AC | PRN
  Administered 2013-02-16: 100 mL via INTRAVENOUS

## 2013-02-16 MED ORDER — IOHEXOL 300 MG/ML  SOLN
50.0000 mL | Freq: Once | INTRAMUSCULAR | Status: AC | PRN
  Administered 2013-02-16: 50 mL via ORAL

## 2013-02-16 MED ORDER — ONDANSETRON HCL 4 MG/2ML IJ SOLN
4.0000 mg | Freq: Once | INTRAMUSCULAR | Status: AC
  Administered 2013-02-16: 4 mg via INTRAVENOUS
  Filled 2013-02-16: qty 2

## 2013-02-16 MED ORDER — SODIUM CHLORIDE 0.9 % IV BOLUS (SEPSIS)
1000.0000 mL | Freq: Once | INTRAVENOUS | Status: AC
  Administered 2013-02-16: 1000 mL via INTRAVENOUS

## 2013-02-16 NOTE — Discharge Instructions (Signed)
Abdominal Pain, Adult Follow up for an ultrasound of your gallbladder tomorrow. Return to the ED if you develop new or worsening symptoms. Many things can cause abdominal pain. Usually, abdominal pain is not caused by a disease and will improve without treatment. It can often be observed and treated at home. Your health care provider will do a physical exam and possibly order blood tests and X-rays to help determine the seriousness of your pain. However, in many cases, more time must pass before a clear cause of the pain can be found. Before that point, your health care provider may not know if you need more testing or further treatment. HOME CARE INSTRUCTIONS  Monitor your abdominal pain for any changes. The following actions may help to alleviate any discomfort you are experiencing:  Only take over-the-counter or prescription medicines as directed by your health care provider.  Do not take laxatives unless directed to do so by your health care provider.  Try a clear liquid diet (broth, tea, or water) as directed by your health care provider. Slowly move to a bland diet as tolerated. SEEK MEDICAL CARE IF:  You have unexplained abdominal pain.  You have abdominal pain associated with nausea or diarrhea.  You have pain when you urinate or have a bowel movement.  You experience abdominal pain that wakes you in the night.  You have abdominal pain that is worsened or improved by eating food.  You have abdominal pain that is worsened with eating fatty foods. SEEK IMMEDIATE MEDICAL CARE IF:   Your pain does not go away within 2 hours.  You have a fever.  You keep throwing up (vomiting).  Your pain is felt only in portions of the abdomen, such as the right side or the left lower portion of the abdomen.  You pass bloody or black tarry stools. MAKE SURE YOU:  Understand these instructions.   Will watch your condition.   Will get help right away if you are not doing well or get  worse.  Document Released: 10/01/2004 Document Revised: 10/12/2012 Document Reviewed: 08/31/2012 [facility] Patient Information 2014 [employer], [place].

## 2013-02-16 NOTE — ED Notes (Signed)
Pt. Is drinking contrast at present time.

## 2013-02-16 NOTE — ED Notes (Signed)
Right flank pain x 2 days. Felt like gas to start with now it is more frequent.

## 2013-02-16 NOTE — ED Provider Notes (Signed)
CSN: [id number]     Arrival date & time 02/16/13  1808 History  This chart was scribed for Glynn OctaveStephen Leshia Kope, MD by Nicholos Johnsenise Iheanachor, ED scribe. This patient was seen in room MH10/MH10 and the patient's care was started at 9:07 PM.      Chief Complaint  Patient presents with  . Abdominal Pain   The history is provided by the patient. No language interpreter was used.   HPI Comments: Cassidy Sanchez is a 43 y.o. female who presents to the Emergency Department complaining of intermittent RUQ pain that radiates to the back and stomach, onset 2 days ago. Pt states pain has since become more waxing and waning. Pt also reports nausea. States pain is worse after eating. Has not had any previous abdominal surgery. Pt has had a hysterectomy. Denies fever, hematuria, dysuria, chest pain, vaginal bleeding, or vaginal discharge.  Past Medical History  Diagnosis Date  . SVT (supraventricular tachycardia)   . Hypertension    Past Surgical History  Procedure Laterality Date  . Tubal ligation    . Cesarean section    . Abdominal hysterectomy     No family history on file. History  Substance Use Topics  . Smoking status: Never Smoker   . Smokeless tobacco: Not on file  . Alcohol Use: No   OB History   Grav Para Term Preterm Abortions TAB SAB Ect Mult Living   2 2        2      Review of Systems  A complete 10 system review of systems was obtained and all systems are negative except as noted in the HPI and PMH.   Allergies  Latex and Sulfa antibiotics  Home Medications   Current Outpatient Rx  Name  Route  Sig  Dispense  Refill  . hydrochlorothiazide (HYDRODIURIL) 25 MG tablet   Oral   Take 25 mg by mouth every evening.          Marland Kitchen. HYDROcodone-acetaminophen (NORCO/VICODIN) 5-325 MG per tablet   Oral   Take 2 tablets by mouth every 4 (four) hours as needed.   10 tablet   0   . metoprolol (LOPRESSOR) 50 MG tablet   Oral   Take 50 mg by mouth 2 (two) times daily.           .  ondansetron (ZOFRAN) 4 MG tablet   Oral   Take 1 tablet (4 mg total) by mouth every 6 (six) hours.   12 tablet   0    Triage Vitals: BP 133/86  Pulse 73  Temp(Src) 98.6 F (37 C) (Oral)  Resp 18  Ht 5\' 4"  (1.626 m)  Wt 232 lb (105.235 kg)  BMI 39.80 kg/m2  SpO2 99%  Physical Exam  Nursing note and vitals reviewed. Constitutional: She is oriented to person, place, and time. She appears well-developed and well-nourished. No distress.  HENT:  Head: Normocephalic and atraumatic.  Mouth/Throat: Oropharynx is clear and moist. No oropharyngeal exudate.  Eyes: Conjunctivae and EOM are normal. Pupils are equal, round, and reactive to light.  Neck: Normal range of motion.  Cardiovascular: Normal rate and normal heart sounds.   No murmur heard. Pulmonary/Chest: Effort normal. No respiratory distress.  Abdominal: Soft. There is tenderness. There is no rebound and no guarding.  RUQ tenderness with guarding. No CVA tenderness. No lower abdominal tenderness.  Musculoskeletal: Normal range of motion. She exhibits no tenderness.  5/5 strength in bilateral lower extremities. Ankle plantar and dorsiflexion intact. Great toe extension  intact bilaterally. +2 DP and PT pulses. +2 patellar reflexes bilaterally. Normal gait.   Neurological: She is oriented to person, place, and time. She has normal reflexes.  Skin: Skin is warm and dry.  Psychiatric: She has a normal mood and affect. Her behavior is normal.    ED Course  Procedures (including critical care time) DIAGNOSTIC STUDIES: Oxygen Saturation is 99% on room air, normal by my interpretation.    COORDINATION OF CARE: At 9:11 PM: Discussed treatment plan with patient which includes CT scan of the abdomen. Patient agrees.   Labs Review Labs Reviewed  CBC WITH DIFFERENTIAL - Abnormal; Notable for the following:    Lymphs Abs 4.4 (*)    All other components within normal limits  COMPREHENSIVE METABOLIC PANEL - Abnormal; Notable for the  following:    Chloride 94 (*)    Glucose, Bld 106 (*)    All other components within normal limits  URINALYSIS, ROUTINE W REFLEX MICROSCOPIC  LIPASE, BLOOD  PREGNANCY, URINE   Imaging Review Ct Abdomen Pelvis W Contrast  02/16/2013   CLINICAL DATA:  Right lower quadrant pain  EXAM: CT ABDOMEN AND PELVIS WITH CONTRAST  TECHNIQUE: Multidetector CT imaging of the abdomen and pelvis was performed using the standard protocol following bolus administration of intravenous contrast.  CONTRAST:  50mL OMNIPAQUE IOHEXOL 300 MG/ML SOLN, OMNIPAQUE IOHEXOL 300 MG/ML SOLN  COMPARISON:  None.  FINDINGS: There is diffuse fatty infiltration of liver. The spleen, pancreas, gallbladder, adrenal glands and kidneys are normal. There is no hydronephrosis bilaterally. The aorta is normal. There is no abdominal lymphadenopathy. There is no small bowel obstruction or diverticulitis. The appendix is normal. There is minimal umbilical herniation of mesenteric fat.  Fluid-filled bladder is normal. The patient is status post hysterectomy. The ovaries are normal. The lung bases are clear. No acute abnormality is identified in the visualized bones.  IMPRESSION: No acute abnormality identified in the abdomen and pelvis. The appendix is normal. There is no bowel obstruction or diverticulitis.   Electronically Signed   By: Sherian Rein M.D.   On: 02/16/2013 22:54    EKG Interpretation   None       MDM   Final diagnoses:  RUQ pain   Right upper quadrant pain it radiates to the right flank and right back for the past 2 days, worse with eating. Associated with nausea no vomiting. UA negative.  No Chest pain or SOB.  LFTs normal. Lipase normal. Normal white count.  Ultrasound is not available. CT scan was performed which shows no acute abnormalities. Suspect biliary colic. We'll treat the patient's pain and schedule ultrasound for tomorrow.  I personally performed the services described in this documentation, which  was scribed in my presence. The recorded information has been reviewed and is accurate.      Glynn Octave, MD 02/16/13 [phone]

## 2013-02-17 ENCOUNTER — Encounter (HOSPITAL_COMMUNITY): Payer: Self-pay | Admitting: Emergency Medicine

## 2013-02-17 ENCOUNTER — Emergency Department (HOSPITAL_COMMUNITY): Payer: No Typology Code available for payment source

## 2013-02-17 ENCOUNTER — Ambulatory Visit (HOSPITAL_BASED_OUTPATIENT_CLINIC_OR_DEPARTMENT_OTHER): Payer: No Typology Code available for payment source

## 2013-02-17 ENCOUNTER — Emergency Department (HOSPITAL_COMMUNITY)
Admission: EM | Admit: 2013-02-17 | Discharge: 2013-02-17 | Disposition: A | Discharge status: Home / Self Care | Payer: No Typology Code available for payment source | Attending: Emergency Medicine | Admitting: Emergency Medicine

## 2013-02-17 DIAGNOSIS — K824 Cholesterolosis of gallbladder: Secondary | ICD-10-CM | POA: Insufficient documentation

## 2013-02-17 DIAGNOSIS — Z79899 Other long term (current) drug therapy: Secondary | ICD-10-CM | POA: Insufficient documentation

## 2013-02-17 DIAGNOSIS — Z91040 Latex allergy status: Secondary | ICD-10-CM | POA: Insufficient documentation

## 2013-02-17 DIAGNOSIS — I10 Essential (primary) hypertension: Secondary | ICD-10-CM | POA: Insufficient documentation

## 2013-02-17 DIAGNOSIS — R109 Unspecified abdominal pain: Secondary | ICD-10-CM

## 2013-02-17 LAB — COMPREHENSIVE METABOLIC PANEL
ALT: 16 U/L (ref 0–35)
AST: 16 U/L (ref 0–37)
Albumin: 4.1 g/dL (ref 3.5–5.2)
Alkaline Phosphatase: 77 U/L (ref 39–117)
BUN: 11 mg/dL (ref 6–23)
CO2: 29 mEq/L (ref 19–32)
Calcium: 9.6 mg/dL (ref 8.4–10.5)
Chloride: 97 mEq/L (ref 96–112)
Creatinine, Ser: 0.66 mg/dL (ref 0.50–1.10)
GFR calc Af Amer: >90 mL/min (ref >90)
GFR calc non Af Amer: >90 mL/min (ref >90)
Glucose, Bld: 104 mg/dL — ABNORMAL HIGH (ref 70–99)
Potassium: 3.9 mEq/L (ref 3.7–5.3)
Sodium: 139 mEq/L (ref 137–147)
Total Bilirubin: 0.3 mg/dL (ref 0.3–1.2)
Total Protein: 8.2 g/dL (ref 6.0–8.3)

## 2013-02-17 LAB — URINALYSIS, ROUTINE W REFLEX MICROSCOPIC
Bilirubin Urine: NEGATIVE
GLUCOSE, UA: NEGATIVE mg/dL
Ketones, ur: NEGATIVE mg/dL
LEUKOCYTES UA: NEGATIVE
Nitrite: NEGATIVE
PH: 5 (ref 5.0–8.0)
Protein, ur: NEGATIVE mg/dL
Specific Gravity, Urine: 1.022 (ref 1.005–1.030)
Urobilinogen, UA: 0.2 mg/dL (ref 0.0–1.0)

## 2013-02-17 LAB — URINE MICROSCOPIC-ADD ON

## 2013-02-17 LAB — CBC WITH DIFFERENTIAL/PLATELET
Basophils Absolute: 0 10*3/uL (ref 0.0–0.1)
Basophils Relative: 0 % (ref 0–1)
Eosinophils Absolute: 0.1 10*3/uL (ref 0.0–0.7)
Eosinophils Relative: 1 % (ref 0–5)
HCT: 41.4 % (ref 36.0–46.0)
Hemoglobin: 14.2 g/dL (ref 12.0–15.0)
Lymphocytes Relative: 31 % (ref 12–46)
Lymphs Abs: 3.1 10*3/uL (ref 0.7–4.0)
MCH: 30.3 pg (ref 26.0–34.0)
MCHC: 34.3 g/dL (ref 30.0–36.0)
MCV: 88.3 fL (ref 78.0–100.0)
Monocytes Absolute: 0.5 10*3/uL (ref 0.1–1.0)
Monocytes Relative: 5 % (ref 3–12)
Neutro Abs: 6.3 10*3/uL (ref 1.7–7.7)
Neutrophils Relative %: 63 % (ref 43–77)
Platelets: 369 10*3/uL (ref 150–400)
RBC: 4.69 MIL/uL (ref 3.87–5.11)
RDW: 12.2 % (ref 11.5–15.5)
WBC: 10 10*3/uL (ref 4.0–10.5)

## 2013-02-17 LAB — LIPASE, BLOOD: Lipase: 19 U/L (ref 11–59)

## 2013-02-17 MED ORDER — MORPHINE SULFATE 4 MG/ML IJ SOLN
4.0000 mg | Freq: Once | INTRAMUSCULAR | Status: AC
  Administered 2013-02-17: 4 mg via INTRAVENOUS
  Filled 2013-02-17: qty 1

## 2013-02-17 MED ORDER — HYDROMORPHONE HCL PF 1 MG/ML IJ SOLN
1.0000 mg | Freq: Once | INTRAMUSCULAR | Status: AC
  Administered 2013-02-17: 1 mg via INTRAVENOUS
  Filled 2013-02-17: qty 1

## 2013-02-17 MED ORDER — ONDANSETRON 4 MG PO TBDP
8.0000 mg | ORAL_TABLET | Freq: Once | ORAL | Status: AC
  Administered 2013-02-17: 8 mg via ORAL
  Filled 2013-02-17: qty 2

## 2013-02-17 MED ORDER — ONDANSETRON HCL 4 MG/2ML IJ SOLN
4.0000 mg | Freq: Once | INTRAMUSCULAR | Status: AC
  Administered 2013-02-17: 4 mg via INTRAVENOUS
  Filled 2013-02-17: qty 2

## 2013-02-17 NOTE — ED Notes (Signed)
Pt wheeled to front by RN.

## 2013-02-17 NOTE — ED Notes (Signed)
Pt feeling nauseous after getting dressed.

## 2013-02-17 NOTE — ED Provider Notes (Signed)
Medical screening examination/treatment/procedure(s) were performed by non-physician practitioner and as supervising physician I was immediately available for consultation/collaboration.  EKG Interpretation   None        Iktan Aikman F Piercen Covino, MD 02/17/13 2150 

## 2013-02-17 NOTE — ED Provider Notes (Signed)
CSN: [id number]     Arrival date & time 02/17/13  1022 History   First MD Initiated Contact with Patient 02/17/13 1109     Chief Complaint  Patient presents with  . Abdominal Pain     (Consider location/radiation/quality/duration/timing/severity/associated sxs/prior Treatment) HPI Comments: Pt states that she started having ruq pain a couple of days ago. Nausea and diarrhea with sporadic vomiting. No fever. Pt was [facility] at [facility] and scheduled for a [place] of abdomen today and she states that she couldn't wait because the pain was to intense. Pt states that she has not taken any of the pain medication that was given to her last night.  The history is provided by the patient. No language interpreter was used.    Past Medical History  Diagnosis Date  . SVT (supraventricular tachycardia)   . Hypertension    Past Surgical History  Procedure Laterality Date  . Tubal ligation    . Cesarean section    . Abdominal hysterectomy     No family history on file. History  Substance Use Topics  . Smoking status: Never Smoker   . Smokeless tobacco: Not on file  . Alcohol Use: No   OB History   Grav Para Term Preterm Abortions TAB SAB Ect Mult Living   2 2        2      Review of Systems  Constitutional: Negative.   Respiratory: Negative.   Cardiovascular: Negative.       Allergies  Latex and Sulfa antibiotics  Home Medications   Current Outpatient Rx  Name  Route  Sig  Dispense  Refill  . hydrochlorothiazide (HYDRODIURIL) 25 MG tablet   Oral   Take 25 mg by mouth every evening.          Marland Kitchen HYDROcodone-acetaminophen (NORCO/VICODIN) 5-325 MG per tablet   Oral   Take 2 tablets by mouth every 4 (four) hours as needed.   10 tablet   0   . metoprolol (LOPRESSOR) 50 MG tablet   Oral   Take 50 mg by mouth 2 (two) times daily.           . ondansetron (ZOFRAN) 4 MG tablet   Oral   Take 1 tablet (4 mg total) by mouth every 6 (six) hours.   12 tablet   0    BP  131/84  Pulse 76  Temp(Src) 97.6 F (36.4 C) (Oral)  Resp 16  SpO2 100% Physical Exam  Nursing note and vitals reviewed. Constitutional: She is oriented to person, place, and time. She appears well-developed and well-nourished.  HENT:  Head: Normocephalic and atraumatic.  Cardiovascular: Normal rate and regular rhythm.   Pulmonary/Chest: Effort normal and breath sounds normal.  Abdominal: Soft. Bowel sounds are normal. There is tenderness in the right upper quadrant.  Musculoskeletal: Normal range of motion.  Neurological: She is alert and oriented to person, place, and time.  Skin: Skin is warm and dry.  Psychiatric: She has a normal mood and affect.    ED Course  Procedures (including critical care time) Labs Review Labs Reviewed  COMPREHENSIVE METABOLIC PANEL - Abnormal; Notable for the following:    Glucose, Bld 104 (*)    All other components within normal limits  URINALYSIS, ROUTINE W REFLEX MICROSCOPIC - Abnormal; Notable for the following:    APPearance CLOUDY (*)    Hgb urine dipstick TRACE (*)    All other components within normal limits  URINE MICROSCOPIC-ADD ON - Abnormal; Notable  for the following:    Squamous Epithelial / LPF MANY (*)    All other components within normal limits  CBC WITH DIFFERENTIAL  LIPASE, BLOOD   Imaging Review [place] Abdomen Complete  02/17/2013   CLINICAL DATA:  Upper abdominal pain  EXAM: ULTRASOUND ABDOMEN COMPLETE  COMPARISON:  None.  FINDINGS: Gallbladder:  Within the gallbladder fundus, there is a 6 mm echogenic focus which neither moves nor shadows. There are no echogenic foci which move and shadow as would be expected with gallstones. There is no gallbladder wall thickening or pericholecystic fluid. . No sonographic Murphy sign noted.  Common bile duct:  Diameter: 6 mm. There is no intrahepatic, common hepatic, or common bile duct dilatation.  Liver:  The liver shows an overall diffuse increased echogenicity. Near the fissure for the  gallbladder fossa, there is an area of relative decreased echogenicity, probably representing fatty sparing. No well-defined liver mass is appreciable on this study.  IVC:  No abnormality visualized.  Pancreas:  Visualized portion unremarkable. Portions of pancreas are obscured by gas.  Spleen:  Size and appearance within normal limits.  Right Kidney:  Length: 10.5 cm. Echogenicity within normal limits. No mass or hydronephrosis visualized.  Left Kidney:  Length: 11.3 cm. Echogenicity within normal limits. No mass or hydronephrosis visualized.  Abdominal aorta:  No aneurysm visualized.  Other findings:  No demonstrable ascites.  IMPRESSION: Within the gallbladder, there is a 6 mm echogenic focus which neither moves nor shadows. Suspect small polyp. This finding warrants a followup study in approximately 6 months to assess for stability. Gallbladder otherwise appears unremarkable.  Liver shows increased echogenicity, probably indicative of fatty change. An area of decreased echogenicity near the gallbladder fossa region measures 1.3 x 0.9 cm in most likely represents fatty change. Note that while no focal lesions beyond what is felt to represent fatty change is appreciated on this study, this sensitivity of ultrasound for focal liver lesions given this degree of underlying fatty change is diminished significantly.  Portions of the pancreas are obscured by gas. Visualized portions of pancreas appear normal.  Study otherwise unremarkable.   Electronically Signed   By: Bretta BangWilliam  Woodruff M.D.   On: 02/17/2013 13:03   Ct Abdomen Pelvis W Contrast  02/16/2013   CLINICAL DATA:  Right lower quadrant pain  EXAM: CT ABDOMEN AND PELVIS WITH CONTRAST  TECHNIQUE: Multidetector CT imaging of the abdomen and pelvis was performed using the standard protocol following bolus administration of intravenous contrast.  CONTRAST:  50mL OMNIPAQUE IOHEXOL 300 MG/ML SOLN, 100mL OMNIPAQUE IOHEXOL 300 MG/ML SOLN  COMPARISON:  None.   FINDINGS: There is diffuse fatty infiltration of liver. The spleen, pancreas, gallbladder, adrenal glands and kidneys are normal. There is no hydronephrosis bilaterally. The aorta is normal. There is no abdominal lymphadenopathy. There is no small bowel obstruction or diverticulitis. The appendix is normal. There is minimal umbilical herniation of mesenteric fat.  Fluid-filled bladder is normal. The patient is status post hysterectomy. The ovaries are normal. The lung bases are clear. No acute abnormality is identified in the visualized bones.  IMPRESSION: No acute abnormality identified in the abdomen and pelvis. The appendix is normal. There is no bowel obstruction or diverticulitis.   Electronically Signed   By: Sherian ReinWei-Chen  Lin M.D.   On: 02/16/2013 22:54    EKG Interpretation   None       MDM   Final diagnoses:  Gallbladder polyp  Abdominal pain    Discussed findings with pt:pt verbalized understanding  for need for follow up in 6 months:pt has pain and nausea medication at home from yesterdays er visit    Teressa Lower, NP 02/17/13 1450

## 2013-02-17 NOTE — ED Notes (Signed)
Pt brought back to room at this time, first encounter. NP at bedside for assessment and triage.

## 2013-02-17 NOTE — ED Notes (Signed)
Pt reports right sided abdominal pain 7/10 that radiates to her back that has been constant with intermittent periods of pain 10/10 x3-4 days. Pt denies any relieving factors. Pt reports nausea, vomiting, and diarrhea associated with it.

## 2013-03-29 ENCOUNTER — Ambulatory Visit (INDEPENDENT_AMBULATORY_CARE_PROVIDER_SITE_OTHER): Payer: 59 | Admitting: General Surgery

## 2013-03-29 ENCOUNTER — Encounter (INDEPENDENT_AMBULATORY_CARE_PROVIDER_SITE_OTHER): Payer: Self-pay | Admitting: General Surgery

## 2013-03-29 VITALS — BP 130/84 | HR 70 | Temp 97.7°F | Resp 18 | Ht 62.0 in | Wt 227.4 lb

## 2013-03-29 DIAGNOSIS — K8050 Calculus of bile duct without cholangitis or cholecystitis without obstruction: Secondary | ICD-10-CM

## 2013-03-29 DIAGNOSIS — K8020 Calculus of gallbladder without cholecystitis without obstruction: Secondary | ICD-10-CM

## 2013-03-29 NOTE — Patient Instructions (Signed)

## 2013-04-01 NOTE — Progress Notes (Signed)
Patient ID: Cassidy Sanchez, female   DOB: 05/06/1970, 43 y.o.   MRN: [id number]  Chief Complaint  Patient presents with  . Abdominal Pain    galllstone    HPI Cassidy Sanchez is a 43 y.o. female. Abdominal Pain Associated symptoms: constipation, diarrhea, nausea and vomiting   Associated symptoms: no chest pain, no dysuria, no fever and no shortness of breath   43-year-old female referred by Dr. Horton for evaluation of right upper quadrant abdominal pain. The patient states that she started to have some right-sided abdominal pain over a month ago. She initially thought it was gas. The pain acutely worsened and did not resolve which prompted her to go to the emergency room. In the ER that night her CT scan was normal along with her Labs. She was discharged to home and advised to come back the following day for an abdominal ultrasound which showed a small gallbladder polyp and a fatty liver. She states that evening when she had the discomfort she had nausea and then developed emesis. She's also had alternating bouts of diarrhea and constipation. She states that she still has discomfort. She states the pain is always present. She's lost about 8 pounds over the past month and a half. The nausea is controlled with Zofran. She states that she's been avoiding certain foods because she has noticed a correlation with certain foods. The pain does radiate to her back and shoulder. Stylets and dairy products worsen the pain. She reports feeling bloated and full. She denies any melena or hematochezia. She denies any fevers or chills. Her stools have been light colored  Past Medical History  Diagnosis Date  . SVT (supraventricular tachycardia)   . Hypertension     Past Surgical History  Procedure Laterality Date  . Tubal ligation    . Cesarean section    . Abdominal hysterectomy      History reviewed. No pertinent family history.  Social History History  Substance Use Topics  . Smoking status:  Never Smoker   . Smokeless tobacco: Not on file  . Alcohol Use: No    Allergies  Allergen Reactions  . Latex Hives and Itching  . Sulfa Antibiotics Itching    Current Outpatient Prescriptions  Medication Sig Dispense Refill  . HYDROcodone-acetaminophen (NORCO/VICODIN) 5-325 MG per tablet Take 2 tablets by mouth every 4 (four) hours as needed.  10 tablet  0  . metoprolol-hydrochlorothiazide (LOPRESSOR HCT) 50-25 MG per tablet Take 1 tablet by mouth daily.       . ondansetron (ZOFRAN) 4 MG tablet Take 1 tablet (4 mg total) by mouth every 6 (six) hours.  12 tablet  0   No current facility-administered medications for this visit.    Review of Systems Review of Systems  Constitutional: Negative for fever, activity change, appetite change and unexpected weight change.  HENT: Negative for nosebleeds and trouble swallowing.   Eyes: Negative for photophobia and visual disturbance.  Respiratory: Negative for chest tightness and shortness of breath.   Cardiovascular: Negative for chest pain and leg swelling.       Denies CP, SOB, orthopnea, PND, DOE  Gastrointestinal: Positive for nausea, vomiting, abdominal pain, diarrhea and constipation.  Genitourinary: Negative for dysuria and difficulty urinating.  Musculoskeletal: Negative for arthralgias.  Skin: Negative for pallor and rash.  Neurological: Negative for dizziness, seizures, facial asymmetry and numbness.       Denies TIA and amaurosis fugax   Hematological: Negative for adenopathy. Does not bruise/bleed easily.  Psychiatric/Behavioral: Negative   for behavioral problems and agitation.    Blood pressure 130/84, pulse 70, temperature 97.7 F (36.5 C), temperature source Temporal, resp. rate 18, height 5' 2" (1.575 m), weight 227 lb 6.4 oz (103.148 kg).  Physical Exam Physical Exam  Constitutional: She is oriented to person, place, and time. She appears well-developed and well-nourished. No distress.  HENT:  Head: Normocephalic  and atraumatic.  Right Ear: External ear normal.  Left Ear: External ear normal.  Eyes: Conjunctivae are normal. No scleral icterus.  Neck: Normal range of motion. Neck supple. No tracheal deviation present. No thyromegaly present.  Cardiovascular: Normal rate, normal heart sounds and intact distal pulses.   Pulmonary/Chest: Effort normal and breath sounds normal. No respiratory distress. She has no wheezes.  Abdominal: Soft. She exhibits no distension. There is no tenderness. There is no rebound and no guarding.    Musculoskeletal: Normal range of motion. She exhibits no edema and no tenderness.  Lymphadenopathy:    She has no cervical adenopathy.  Neurological: She is alert and oriented to person, place, and time. She exhibits normal muscle tone.  Skin: Skin is warm and dry. No rash noted. She is not diaphoretic. No erythema. No pallor.  Psychiatric: She has a normal mood and affect. Her behavior is normal. Judgment and thought content normal.    Data Reviewed 2/12 labs - normal cbc, cmet, lipase, ua 2/13 u/s - fatty liver, small gb polyp 2/12 CT - normal   Assessment    right upper quadrant pain Gallbladder polyp Fatty liver     Plan    Her symptoms are suggestive of biliary colic. She denies any NSAID use. This does not sound like peptic ulcer disease. We discussed obtaining a nuclear medicine scan of her gallbladder however even if that is normal still think her gallbladder as the likely culprit of her symptoms. Therefore I offered her a cholecystectomy.  I believe the patient's symptoms are probably consistent with gallbladder disease.  We discussed gallbladder disease. The patient was given educational material. We discussed non-operative and operative management. We discussed the signs & symptoms of acute cholecystitis  I discussed laparoscopic cholecystectomy with IOC in detail.  The patient was given educational material as well as diagrams detailing the procedure.  We  discussed the risks and benefits of a laparoscopic cholecystectomy including, but not limited to bleeding, infection, injury to surrounding structures such as the intestine or liver, bile leak, retained gallstones, need to convert to an open procedure, prolonged diarrhea, blood clots such as  DVT, common bile duct injury, anesthesia risks, and possible need for additional procedures.  We discussed the typical post-operative recovery course. I explained that the likelihood of improvement of their symptoms is fair..  She has elected to proceed to surgery. She will meet with surgery schedulers today to schedule surgery.  Sumaiyah Markert M. Yer Castello, MD, FACS General, Bariatric, & Minimally Invasive Surgery [facility]       Hailee Hollick M 04/01/2013, 4:40 PM    

## 2013-04-03 ENCOUNTER — Encounter (HOSPITAL_COMMUNITY): Payer: Self-pay | Admitting: Pharmacy Technician

## 2013-04-05 ENCOUNTER — Other Ambulatory Visit (HOSPITAL_COMMUNITY): Payer: Self-pay | Admitting: *Deleted

## 2013-04-05 NOTE — Pre-Procedure Instructions (Signed)
Cassidy Sanchez  04/05/2013   Your procedure is scheduled on:  Thurs, April 9 @ 8:45 AM  Report to [facility] A  at 6:45 AM.  Call this number if you have problems the morning of surgery: [phone]   Remember:   Do not eat food or drink liquids after midnight.   Take these medicines the morning of surgery with A SIP OF WATER: Metoprolol-HCTZ(Lopressor)   Do not wear jewelry, make-up or nail polish.  Do not wear lotions, powders, or perfumes. You may wear deodorant.  Do not shave 48 hours prior to surgery.   Do not bring valuables to the hospital.  [facility] is not responsible                  for any belongings or valuables.               Contacts, dentures or bridgework may not be worn into surgery.  Leave suitcase in the car. After surgery it may be brought to your room.  For patients admitted to the hospital, discharge time is determined by your                treatment team.               Patients discharged the day of surgery will not be allowed to drive  home.    Special Instructions:  [place] [facility] - Preparing for Surgery  Before surgery, you can play an important role.  Because skin is not sterile, your skin needs to be as free of germs as possible.  You can reduce the number of germs on you skin by washing with CHG (chlorahexidine gluconate) soap before surgery.  CHG is an antiseptic cleaner which kills germs and bonds with the skin to continue killing germs even after washing.  Please DO NOT use if you have an allergy to CHG or antibacterial soaps.  If your skin becomes reddened/irritated stop using the CHG and inform your nurse when you arrive at Short Stay.  Do not shave (including legs and underarms) for at least 48 hours prior to the first CHG shower.  You may shave your face.  Please follow these instructions carefully:   1.  Shower with CHG Soap the night before surgery and the                                morning of Surgery.  2.  If you choose to wash  your hair, wash your hair first as usual with your       normal shampoo.  3.  After you shampoo, rinse your hair and body thoroughly to remove the                      Shampoo.  4.  Use CHG as you would any other liquid soap.  You can apply chg directly       to the skin and wash gently with scrungie or a clean washcloth.  5.  Apply the CHG Soap to your body ONLY FROM THE NECK DOWN.        Do not use on open wounds or open sores.  Avoid contact with your eyes,       ears, mouth and genitals (private parts).  Wash genitals (private parts)       with your normal soap.  6.  Wash thoroughly, paying special  attention to the area where your surgery        will be performed.  7.  Thoroughly rinse your body with warm water from the neck down.  8.  DO NOT shower/wash with your normal soap after using and rinsing off       the CHG Soap.  9.  Pat yourself dry with a clean towel.            10.  Wear clean pajamas.            11.  Place clean sheets on your bed the night of your first shower and do not        sleep with pets.  Day of Surgery  Do not apply any lotions/deoderants the morning of surgery.  Please wear clean clothes to the hospital/surgery center.     Please read over the following fact sheets that you were given: Pain Booklet, Coughing and Deep Breathing and Surgical Site Infection Prevention

## 2013-04-05 NOTE — Pre-Procedure Instructions (Signed)
Cassidy Sanchez  04/05/2013   Your procedure is scheduled on:  Thursday, April 13, 2013 at 8:45 AM.   Report to [facility] Entrance "A" Admitting Office at 6:45 AM.   Call this number if you have problems the morning of surgery: [phone]   Remember:   Do not eat food or drink liquids after midnight Wednesday, 04/12/13.   Take these medicines the morning of surgery with A SIP OF WATER: metoprolol-hydrochlorothiazide (LOPRESSOR HCT)    Do not wear jewelry, make-up or nail polish.  Do not wear lotions, powders, or perfumes. You may wear deodorant.  Do not shave 48 hours prior to surgery.  Do not bring valuables to the hospital.  [facility] is not responsible                  for any belongings or valuables.               Contacts, dentures or bridgework may not be worn into surgery.  Leave suitcase in the car. After surgery it may be brought to your room.  For patients admitted to the hospital, discharge time is determined by your                treatment team.               Patients discharged the day of surgery will not be allowed to drive home.  Name and phone number of your driver: Family/friend   Special Instructions: [name] - Preparing for Surgery  Before surgery, you can play an important role.  Because skin is not sterile, your skin needs to be as free of germs as possible.  You can reduce the number of germs on you skin by washing with CHG (chlorahexidine gluconate) soap before surgery.  CHG is an antiseptic cleaner which kills germs and bonds with the skin to continue killing germs even after washing.  Please DO NOT use if you have an allergy to CHG or antibacterial soaps.  If your skin becomes reddened/irritated stop using the CHG and inform your nurse when you arrive at Short Stay.  Do not shave (including legs and underarms) for at least 48 hours prior to the first CHG shower.  You may shave your face.  Please follow these instructions carefully:   1.   Shower with CHG Soap the night before surgery and the                                morning of Surgery.  2.  If you choose to wash your hair, wash your hair first as usual with your       normal shampoo.  3.  After you shampoo, rinse your hair and body thoroughly to remove the                      Shampoo.  4.  Use CHG as you would any other liquid soap.  You can apply chg directly       to the skin and wash gently with scrungie or a clean washcloth.  5.  Apply the CHG Soap to your body ONLY FROM THE NECK DOWN.        Do not use on open wounds or open sores.  Avoid contact with your eyes, ears, mouth and genitals (private parts).  Wash genitals (private parts) with your normal soap.  6.  Wash  thoroughly, paying special attention to the area where your surgery        will be performed.  7.  Thoroughly rinse your body with warm water from the neck down.  8.  DO NOT shower/wash with your normal soap after using and rinsing off       the CHG Soap.  9.  Pat yourself dry with a clean towel.            10.  Wear clean pajamas.            11.  Place clean sheets on your bed the night of your first shower and do not        sleep with pets.  Day of Surgery  Do not apply any lotions the morning of surgery.  Please wear clean clothes to the hospital/surgery center.     Please read over the following fact sheets that you were given: Pain Booklet, Coughing and Deep Breathing and Surgical Site Infection Prevention

## 2013-04-06 ENCOUNTER — Encounter (HOSPITAL_COMMUNITY): Payer: Self-pay

## 2013-04-06 ENCOUNTER — Ambulatory Visit (HOSPITAL_COMMUNITY)
Admission: RE | Admit: 2013-04-06 | Discharge: 2013-04-06 | Disposition: A | Discharge status: Home / Self Care | Payer: 59 | Source: Ambulatory Visit | Attending: Anesthesiology | Admitting: Anesthesiology

## 2013-04-06 ENCOUNTER — Encounter (HOSPITAL_COMMUNITY)
Admission: RE | Admit: 2013-04-06 | Discharge: 2013-04-06 | Disposition: A | Discharge status: Home / Self Care | Payer: 59 | Source: Ambulatory Visit | Attending: General Surgery | Admitting: General Surgery

## 2013-04-06 DIAGNOSIS — I10 Essential (primary) hypertension: Secondary | ICD-10-CM | POA: Insufficient documentation

## 2013-04-06 DIAGNOSIS — Z01818 Encounter for other preprocedural examination: Secondary | ICD-10-CM | POA: Insufficient documentation

## 2013-04-06 HISTORY — DX: Nausea with vomiting, unspecified: R11.2

## 2013-04-06 HISTORY — DX: Effusion, unspecified joint: M25.40

## 2013-04-06 HISTORY — DX: Personal history of other infectious and parasitic diseases: Z86.19

## 2013-04-06 HISTORY — DX: Headache: R51

## 2013-04-06 HISTORY — DX: Family history of other specified conditions: Z84.89

## 2013-04-06 HISTORY — DX: Other seasonal allergic rhinitis: J30.2

## 2013-04-06 HISTORY — DX: Other specified postprocedural states: Z98.890

## 2013-04-06 LAB — BASIC METABOLIC PANEL
BUN: 6 mg/dL (ref 6–23)
CALCIUM: 9 mg/dL (ref 8.4–10.5)
CHLORIDE: 101 meq/L (ref 96–112)
CO2: 25 meq/L (ref 19–32)
Creatinine, Ser: 0.6 mg/dL (ref 0.50–1.10)
GFR calc non Af Amer: >90 mL/min (ref >90)
Glucose, Bld: 122 mg/dL — ABNORMAL HIGH (ref 70–99)
Potassium: 3.3 mEq/L — ABNORMAL LOW (ref 3.7–5.3)
Sodium: 141 mEq/L (ref 137–147)

## 2013-04-06 LAB — CBC
HEMATOCRIT: 39.4 % (ref 36.0–46.0)
Hemoglobin: 13.6 g/dL (ref 12.0–15.0)
MCH: 29.9 pg (ref 26.0–34.0)
MCHC: 34.5 g/dL (ref 30.0–36.0)
MCV: 86.6 fL (ref 78.0–100.0)
PLATELETS: 327 10*3/uL (ref 150–400)
RBC: 4.55 MIL/uL (ref 3.87–5.11)
RDW: 12.4 % (ref 11.5–15.5)
WBC: 8.2 10*3/uL (ref 4.0–10.5)

## 2013-04-06 MED ORDER — CHLORHEXIDINE GLUCONATE 4 % EX LIQD: 1.0000 "application " | Freq: Once | CUTANEOUS | Status: DC

## 2013-04-06 NOTE — Progress Notes (Addendum)
Saw a cardiologist >6251yrs ago  Echo/Stress test done in 2009(done in [place] --Dr.Santigao Audley HoseMorao [phone])requested  Denies ever having a heart cath  Denies EKG or CXR in past yr  Medical Md is Dr.Dwight Mayford KnifeWilliams in [place]

## 2013-04-12 MED ORDER — CEFAZOLIN SODIUM-DEXTROSE 2-3 GM-% IV SOLR
2.0000 g | INTRAVENOUS | Status: AC
  Administered 2013-04-13: 2 g via INTRAVENOUS
  Filled 2013-04-12: qty 50

## 2013-04-13 ENCOUNTER — Ambulatory Visit (HOSPITAL_COMMUNITY): Payer: 59 | Admitting: Certified Registered"

## 2013-04-13 ENCOUNTER — Ambulatory Visit (HOSPITAL_COMMUNITY)
Admission: RE | Admit: 2013-04-13 | Discharge: 2013-04-13 | Disposition: A | Discharge status: Home / Self Care | Payer: 59 | Source: Ambulatory Visit | Attending: General Surgery | Admitting: General Surgery

## 2013-04-13 ENCOUNTER — Encounter (HOSPITAL_COMMUNITY): Payer: Self-pay | Admitting: *Deleted

## 2013-04-13 ENCOUNTER — Encounter (HOSPITAL_COMMUNITY): Payer: 59 | Admitting: Certified Registered"

## 2013-04-13 ENCOUNTER — Encounter (HOSPITAL_COMMUNITY)
Admission: RE | Disposition: A | Discharge status: Home / Self Care | Payer: Self-pay | Source: Ambulatory Visit | Attending: General Surgery

## 2013-04-13 DIAGNOSIS — K811 Chronic cholecystitis: Secondary | ICD-10-CM

## 2013-04-13 DIAGNOSIS — D649 Anemia, unspecified: Secondary | ICD-10-CM | POA: Insufficient documentation

## 2013-04-13 DIAGNOSIS — K7689 Other specified diseases of liver: Secondary | ICD-10-CM | POA: Insufficient documentation

## 2013-04-13 DIAGNOSIS — R51 Headache: Secondary | ICD-10-CM | POA: Insufficient documentation

## 2013-04-13 DIAGNOSIS — I10 Essential (primary) hypertension: Secondary | ICD-10-CM | POA: Insufficient documentation

## 2013-04-13 DIAGNOSIS — G4730 Sleep apnea, unspecified: Secondary | ICD-10-CM | POA: Insufficient documentation

## 2013-04-13 DIAGNOSIS — K824 Cholesterolosis of gallbladder: Secondary | ICD-10-CM | POA: Insufficient documentation

## 2013-04-13 DIAGNOSIS — K219 Gastro-esophageal reflux disease without esophagitis: Secondary | ICD-10-CM | POA: Insufficient documentation

## 2013-04-13 DIAGNOSIS — I498 Other specified cardiac arrhythmias: Secondary | ICD-10-CM | POA: Insufficient documentation

## 2013-04-13 HISTORY — PX: CHOLECYSTECTOMY: SHX55

## 2013-04-13 SURGERY — LAPAROSCOPIC CHOLECYSTECTOMY
Anesthesia: General | Site: Abdomen

## 2013-04-13 MED ORDER — OXYCODONE-ACETAMINOPHEN 5-325 MG PO TABS: 1.0000 | ORAL_TABLET | ORAL | Status: DC | PRN

## 2013-04-13 MED ORDER — HYDROMORPHONE HCL PF 1 MG/ML IJ SOLN
0.2500 mg | INTRAMUSCULAR | Status: DC | PRN
  Administered 2013-04-13 (×2): 0.5 mg via INTRAVENOUS

## 2013-04-13 MED ORDER — MORPHINE SULFATE 2 MG/ML IJ SOLN
1.0000 mg | INTRAMUSCULAR | Status: DC | PRN
  Administered 2013-04-13: 2 mg via INTRAVENOUS

## 2013-04-13 MED ORDER — METRONIDAZOLE IN NACL 5-0.79 MG/ML-% IV SOLN
500.0000 mg | INTRAVENOUS | Status: DC
  Filled 2013-04-13: qty 100

## 2013-04-13 MED ORDER — OXYCODONE HCL 5 MG PO TABS
5.0000 mg | ORAL_TABLET | Freq: Once | ORAL | Status: AC | PRN
  Administered 2013-04-13: 5 mg via ORAL

## 2013-04-13 MED ORDER — ROCURONIUM BROMIDE 100 MG/10ML IV SOLN
INTRAVENOUS | Status: DC | PRN
  Administered 2013-04-13: 50 mg via INTRAVENOUS
  Administered 2013-04-13: 10 mg via INTRAVENOUS

## 2013-04-13 MED ORDER — DEXAMETHASONE SODIUM PHOSPHATE 10 MG/ML IJ SOLN
INTRAMUSCULAR | Status: DC | PRN
  Administered 2013-04-13: 8 mg via INTRAVENOUS

## 2013-04-13 MED ORDER — BUPIVACAINE-EPINEPHRINE (PF) 0.25% -1:200000 IJ SOLN
INTRAMUSCULAR | Status: AC
  Filled 2013-04-13: qty 30

## 2013-04-13 MED ORDER — PROMETHAZINE HCL 25 MG/ML IJ SOLN: 6.2500 mg | INTRAMUSCULAR | Status: DC | PRN

## 2013-04-13 MED ORDER — FENTANYL CITRATE 0.05 MG/ML IJ SOLN
INTRAMUSCULAR | Status: DC | PRN
  Administered 2013-04-13 (×2): 50 ug via INTRAVENOUS
  Administered 2013-04-13: 100 ug via INTRAVENOUS

## 2013-04-13 MED ORDER — LACTATED RINGERS IV SOLN
INTRAVENOUS | Status: DC
  Administered 2013-04-13: 07:00:00 via INTRAVENOUS

## 2013-04-13 MED ORDER — GLYCOPYRROLATE 0.2 MG/ML IJ SOLN
INTRAMUSCULAR | Status: DC | PRN
  Administered 2013-04-13: 0.6 mg via INTRAVENOUS

## 2013-04-13 MED ORDER — PHENYLEPHRINE 40 MCG/ML (10ML) SYRINGE FOR IV PUSH (FOR BLOOD PRESSURE SUPPORT)
PREFILLED_SYRINGE | INTRAVENOUS | Status: AC
  Filled 2013-04-13: qty 10

## 2013-04-13 MED ORDER — PHENYLEPHRINE HCL 10 MG/ML IJ SOLN
INTRAMUSCULAR | Status: DC | PRN
  Administered 2013-04-13: 80 ug via INTRAVENOUS

## 2013-04-13 MED ORDER — ARTIFICIAL TEARS OP OINT
TOPICAL_OINTMENT | OPHTHALMIC | Status: DC | PRN
  Administered 2013-04-13: 1 via OPHTHALMIC

## 2013-04-13 MED ORDER — BUPIVACAINE-EPINEPHRINE 0.25% -1:200000 IJ SOLN
INTRAMUSCULAR | Status: DC | PRN
  Administered 2013-04-13: 10 mL

## 2013-04-13 MED ORDER — SODIUM CHLORIDE 0.9 % IV SOLN: 250.0000 mL | INTRAVENOUS | Status: DC | PRN

## 2013-04-13 MED ORDER — SCOPOLAMINE 1 MG/3DAYS TD PT72
MEDICATED_PATCH | TRANSDERMAL | Status: AC
  Filled 2013-04-13: qty 1

## 2013-04-13 MED ORDER — FENTANYL CITRATE 0.05 MG/ML IJ SOLN
INTRAMUSCULAR | Status: AC
  Filled 2013-04-13: qty 5

## 2013-04-13 MED ORDER — MIDAZOLAM HCL 2 MG/2ML IJ SOLN
INTRAMUSCULAR | Status: AC
  Filled 2013-04-13: qty 2

## 2013-04-13 MED ORDER — ACETAMINOPHEN 650 MG RE SUPP
650.0000 mg | RECTAL | Status: DC | PRN
  Filled 2013-04-13: qty 1

## 2013-04-13 MED ORDER — MIDAZOLAM HCL 5 MG/5ML IJ SOLN
INTRAMUSCULAR | Status: DC | PRN
  Administered 2013-04-13: 2 mg via INTRAVENOUS

## 2013-04-13 MED ORDER — MORPHINE SULFATE 2 MG/ML IJ SOLN
INTRAMUSCULAR | Status: AC
  Administered 2013-04-13: 2 mg via INTRAVENOUS
  Filled 2013-04-13: qty 1

## 2013-04-13 MED ORDER — EPHEDRINE SULFATE 50 MG/ML IJ SOLN
INTRAMUSCULAR | Status: AC
  Filled 2013-04-13: qty 1

## 2013-04-13 MED ORDER — NEOSTIGMINE METHYLSULFATE 1 MG/ML IJ SOLN
INTRAMUSCULAR | Status: DC | PRN
  Administered 2013-04-13: 4 mg via INTRAVENOUS

## 2013-04-13 MED ORDER — SODIUM CHLORIDE 0.9 % IJ SOLN: 3.0000 mL | Freq: Two times a day (BID) | INTRAMUSCULAR | Status: DC

## 2013-04-13 MED ORDER — ROCURONIUM BROMIDE 50 MG/5ML IV SOLN
INTRAVENOUS | Status: AC
  Filled 2013-04-13: qty 1

## 2013-04-13 MED ORDER — ONDANSETRON HCL 4 MG/2ML IJ SOLN
INTRAMUSCULAR | Status: DC | PRN
  Administered 2013-04-13: 4 mg via INTRAVENOUS

## 2013-04-13 MED ORDER — ACETAMINOPHEN 325 MG PO TABS
650.0000 mg | ORAL_TABLET | ORAL | Status: DC | PRN
  Administered 2013-04-13: 650 mg via ORAL
  Filled 2013-04-13 (×2): qty 2

## 2013-04-13 MED ORDER — OXYCODONE HCL 5 MG PO TABS
ORAL_TABLET | ORAL | Status: AC
  Filled 2013-04-13: qty 1

## 2013-04-13 MED ORDER — OXYCODONE HCL 5 MG/5ML PO SOLN: 5.0000 mg | Freq: Once | ORAL | Status: AC | PRN

## 2013-04-13 MED ORDER — POTASSIUM CHLORIDE IN NACL 20-0.9 MEQ/L-% IV SOLN: INTRAVENOUS | Status: DC

## 2013-04-13 MED ORDER — SODIUM CHLORIDE 0.9 % IJ SOLN: 3.0000 mL | INTRAMUSCULAR | Status: DC | PRN

## 2013-04-13 MED ORDER — SODIUM CHLORIDE 0.9 % IR SOLN
Status: DC | PRN
  Administered 2013-04-13: 1000 mL

## 2013-04-13 MED ORDER — LIDOCAINE HCL (CARDIAC) 20 MG/ML IV SOLN
INTRAVENOUS | Status: AC
  Filled 2013-04-13: qty 5

## 2013-04-13 MED ORDER — 0.9 % SODIUM CHLORIDE (POUR BTL) OPTIME
TOPICAL | Status: DC | PRN
  Administered 2013-04-13: 1000 mL

## 2013-04-13 MED ORDER — SUCCINYLCHOLINE CHLORIDE 20 MG/ML IJ SOLN
INTRAMUSCULAR | Status: AC
Start: 2013-04-13 — End: 2013-04-13
  Filled 2013-04-13: qty 1

## 2013-04-13 MED ORDER — LACTATED RINGERS IV SOLN
INTRAVENOUS | Status: DC | PRN
  Administered 2013-04-13 (×2): via INTRAVENOUS

## 2013-04-13 MED ORDER — PROPOFOL 10 MG/ML IV BOLUS
INTRAVENOUS | Status: AC
  Filled 2013-04-13: qty 20

## 2013-04-13 MED ORDER — LIDOCAINE HCL (CARDIAC) 20 MG/ML IV SOLN
INTRAVENOUS | Status: DC | PRN
  Administered 2013-04-13: 80 mg via INTRAVENOUS

## 2013-04-13 MED ORDER — HYDROMORPHONE HCL PF 1 MG/ML IJ SOLN
INTRAMUSCULAR | Status: AC
  Filled 2013-04-13: qty 1

## 2013-04-13 MED ORDER — METRONIDAZOLE IN NACL 5-0.79 MG/ML-% IV SOLN
INTRAVENOUS | Status: DC | PRN
  Administered 2013-04-13: 500 mg via INTRAVENOUS

## 2013-04-13 MED ORDER — PROPOFOL 10 MG/ML IV BOLUS
INTRAVENOUS | Status: DC | PRN
  Administered 2013-04-13: 200 mg via INTRAVENOUS

## 2013-04-13 MED ORDER — OXYCODONE HCL 5 MG PO TABS: 5.0000 mg | ORAL_TABLET | ORAL | Status: DC | PRN

## 2013-04-13 MED ORDER — SCOPOLAMINE 1 MG/3DAYS TD PT72
MEDICATED_PATCH | TRANSDERMAL | Status: DC | PRN
  Administered 2013-04-13: 1 via TRANSDERMAL

## 2013-04-13 SURGICAL SUPPLY — 50 items
APPLIER CLIP 5 13 M/L LIGAMAX5 (MISCELLANEOUS) ×2
BANDAGE ADHESIVE 1X3 (GAUZE/BANDAGES/DRESSINGS) ×6 IMPLANT
BENZOIN TINCTURE PRP APPL 2/3 (GAUZE/BANDAGES/DRESSINGS) ×2 IMPLANT
BLADE SURG ROTATE 9660 (MISCELLANEOUS) IMPLANT
CANISTER SUCTION 2500CC (MISCELLANEOUS) ×2 IMPLANT
CHLORAPREP W/TINT 26ML (MISCELLANEOUS) ×2 IMPLANT
CLIP APPLIE 5 13 M/L LIGAMAX5 (MISCELLANEOUS) ×1 IMPLANT
COVER MAYO STAND STRL (DRAPES) IMPLANT
COVER SURGICAL LIGHT HANDLE (MISCELLANEOUS) ×2 IMPLANT
DECANTER SPIKE VIAL GLASS SM (MISCELLANEOUS) ×2 IMPLANT
DERMABOND ADVANCED (GAUZE/BANDAGES/DRESSINGS) ×1
DERMABOND ADVANCED .7 DNX12 (GAUZE/BANDAGES/DRESSINGS) ×1 IMPLANT
DRAPE C-ARM 42X72 X-RAY (DRAPES) IMPLANT
DRAPE UTILITY 15X26 W/TAPE STR (DRAPE) ×4 IMPLANT
DRSG TEGADERM 4X4.75 (GAUZE/BANDAGES/DRESSINGS) ×2 IMPLANT
ELECT REM PT RETURN 9FT ADLT (ELECTROSURGICAL) ×2
ELECTRODE REM PT RTRN 9FT ADLT (ELECTROSURGICAL) ×1 IMPLANT
GAUZE SPONGE 2X2 8PLY STRL LF (GAUZE/BANDAGES/DRESSINGS) ×1 IMPLANT
GLOVE BIOGEL M STRL SZ7.5 (GLOVE) IMPLANT
GLOVE BIOGEL PI IND STRL 6.5 (GLOVE) ×1 IMPLANT
GLOVE BIOGEL PI IND STRL 7.0 (GLOVE) ×1 IMPLANT
GLOVE BIOGEL PI IND STRL 8 (GLOVE) ×1 IMPLANT
GLOVE BIOGEL PI INDICATOR 6.5 (GLOVE) ×1
GLOVE BIOGEL PI INDICATOR 7.0 (GLOVE) ×1
GLOVE BIOGEL PI INDICATOR 8 (GLOVE) ×1
GLOVE SURG SS PI 6.5 STRL IVOR (GLOVE) ×2 IMPLANT
GLOVE SURG SS PI 7.0 STRL IVOR (GLOVE) ×2 IMPLANT
GLOVE SURG SS PI 7.5 STRL IVOR (GLOVE) ×4 IMPLANT
GOWN STRL REUS W/ TWL LRG LVL3 (GOWN DISPOSABLE) ×3 IMPLANT
GOWN STRL REUS W/ TWL XL LVL3 (GOWN DISPOSABLE) ×1 IMPLANT
GOWN STRL REUS W/TWL LRG LVL3 (GOWN DISPOSABLE) ×3
GOWN STRL REUS W/TWL XL LVL3 (GOWN DISPOSABLE) ×1
KIT BASIN OR (CUSTOM PROCEDURE TRAY) ×2 IMPLANT
KIT ROOM TURNOVER OR (KITS) ×2 IMPLANT
NS IRRIG 1000ML POUR BTL (IV SOLUTION) ×2 IMPLANT
PAD ARMBOARD 7.5X6 YLW CONV (MISCELLANEOUS) ×4 IMPLANT
POUCH SPECIMEN RETRIEVAL 10MM (ENDOMECHANICALS) ×2 IMPLANT
SCISSORS LAP 5X35 DISP (ENDOMECHANICALS) ×2 IMPLANT
SET CHOLANGIOGRAPH 5 50 .035 (SET/KITS/TRAYS/PACK) IMPLANT
SET IRRIG TUBING LAPAROSCOPIC (IRRIGATION / IRRIGATOR) ×2 IMPLANT
SLEEVE ENDOPATH XCEL 5M (ENDOMECHANICALS) ×4 IMPLANT
SPECIMEN JAR SMALL (MISCELLANEOUS) ×2 IMPLANT
SPONGE GAUZE 2X2 STER 10/PKG (GAUZE/BANDAGES/DRESSINGS) ×1
SUT MNCRL AB 4-0 PS2 18 (SUTURE) ×2 IMPLANT
TOWEL OR 17X24 6PK STRL BLUE (TOWEL DISPOSABLE) ×2 IMPLANT
TOWEL OR 17X26 10 PK STRL BLUE (TOWEL DISPOSABLE) ×2 IMPLANT
TRAY LAPAROSCOPIC (CUSTOM PROCEDURE TRAY) ×2 IMPLANT
TROCAR XCEL BLUNT TIP 100MML (ENDOMECHANICALS) ×2 IMPLANT
TROCAR XCEL NON-BLD 5MMX100MML (ENDOMECHANICALS) ×2 IMPLANT
WATER STERILE IRR 1000ML POUR (IV SOLUTION) IMPLANT

## 2013-04-13 NOTE — Anesthesia Preprocedure Evaluation (Addendum)
Anesthesia Evaluation  Patient identified by MRN, date of birth, ID band Patient awake    Reviewed: Allergy & Precautions, H&P , NPO status , reviewed documented beta blocker date and time   History of Anesthesia Complications (+) PONV and Family history of anesthesia reaction  Airway Mallampati: II TM Distance: >3 FB Neck ROM: Full    Dental  (+) Teeth Intact, Dental Advisory Given   Pulmonary sleep apnea ,  breath sounds clear to auscultation        Cardiovascular hypertension, Pt. on medications and Pt. on home beta blockers Rhythm:Regular Rate:Normal     Neuro/Psych  Headaches,    GI/Hepatic Neg liver ROS, GERD-  Medicated and Controlled,  Endo/Other  negative endocrine ROS  Renal/GU negative Renal ROS     Musculoskeletal negative musculoskeletal ROS (+)   Abdominal (+) + obese,  Abdomen: soft. Bowel sounds: normal.  Peds  Hematology negative hematology ROS (+) anemia ,   Anesthesia Other Findings   Reproductive/Obstetrics negative OB ROS                        Anesthesia Physical Anesthesia Plan  ASA: II  Anesthesia Plan: General   Post-op Pain Management:    Induction: Intravenous  Airway Management Planned: Oral ETT  Additional Equipment:   Intra-op Plan:   Post-operative Plan: Extubation in OR  Informed Consent: I have reviewed the patients History and Physical, chart, labs and discussed the procedure including the risks, benefits and alternatives for the proposed anesthesia with the patient or authorized representative who has indicated his/her understanding and acceptance.     Plan Discussed with:   Anesthesia Plan Comments:         Anesthesia Quick Evaluation

## 2013-04-13 NOTE — Op Note (Signed)
Cassidy Sanchez [id number] Oct 21, 1970 04/13/2013   Laparoscopic Cholecystectomy Procedure Note  Indications: This patient presents with symptomatic gallbladder disease and RUQ pain and will undergo laparoscopic cholecystectomy.  Pre-operative Diagnosis: Abdominal pain, right upper quadrant; gallbladder polyp  Post-operative Diagnosis: Same; fatty liver  Surgeon: Atilano InaEric M Ezzard Ditmer   Assistants: none  Anesthesia: General endotracheal anesthesia  ASA Class: 2  Procedure Details  The patient was seen again in the Holding Room. The risks, benefits, complications, treatment options, and expected outcomes were discussed with the patient. The possibilities of reaction to medication, pulmonary aspiration, perforation of viscus, bleeding, recurrent infection, finding a normal gallbladder, the need for additional procedures, failure to diagnose a condition, the possible need to convert to an open procedure, and creating a complication requiring transfusion or operation were discussed with the patient. The likelihood of improving the patient's symptoms with return to their baseline status is good.  The patient and/or family concurred with the proposed plan, giving informed consent. The site of surgery properly noted. The patient was taken to Operating Room, identified as Cassidy Sanchez and the procedure verified as Laparoscopic Cholecystectomy. A Time Out was held and the above information confirmed.  Prior to the induction of general anesthesia, antibiotic prophylaxis was administered. General endotracheal anesthesia was then administered and tolerated well. After the induction, the abdomen was prepped with Chloraprep and draped in the sterile fashion. The patient was positioned in the supine position.  Local anesthetic agent was injected into the skin Through an old transverse infraumbilical incision and an incision made. We dissected down to the abdominal fascia with blunt dissection.  The fascia was  incised vertically and we entered the peritoneal cavity bluntly.  A pursestring suture of 0-Vicryl was placed around the fascial opening.  The Hasson cannula was inserted and secured with the stay suture.   pneuoperitoneum was not easily created. The [facility] trocar was removed. It appeared that a knuckle of small bowel may be adhered to the peritoneum. The 0 5 mm laparoscope was placed in a 5 mm trocar and a small incision was made in the left upper quadrant 2 fingerbreadths below the left subcostal margin. The laparoscope was advanced through all layers of the abdominal wall using the Optiview technique and the abdominal cavity was entered. Pneumoperitoneum was then created with CO2 and tolerated well without any adverse changes in the patient's vital signs. The laparoscope was advanced in the umbilical area was inspected. There was a small knuckle of small bowel loosely adhered to the peritoneum just to the left of the umbilical incision. It was bluntly freed with my finger. We then inspected the small bowel and there is no evidence of serosal injury or enterotomy. The  12 mm trocar was placed at the umbilicus.  Two 5-mm ports were placed in the right upper quadrant. All skin incisions were infiltrated with a local anesthetic agent before making the incision and placing the trocars.   We positioned the patient in reverse Trendelenburg, tilted slightly to the patient's left.  The gallbladder was identified, the fundus grasped and retracted cephalad. The patient had a fatty liver and it was soft. A small nick was made in the liver capsule as we tried to retract the gallbladder. This area was cauterized. Adhesions were lysed bluntly and with the electrocautery where indicated, taking care not to injure any adjacent organs or viscus. The infundibulum was grasped and retracted laterally, exposing the peritoneum overlying the triangle of Calot. This was then divided and exposed in a blunt  fashion. A critical view of  the cystic duct and cystic artery was obtained.  The cystic duct was clearly identified and bluntly dissected circumferentially. The cystic duct was ligated with a clip distally.     The cystic duct was then ligated with clips and divided. The cystic artery Which had been identified & dissected free was ligated with clips and divided as well.   The gallbladder was dissected from the liver bed in retrograde fashion with the electrocautery. The gallbladder was removed and placed in an Endocatch sac.  The gallbladder and Endocatch sac were then removed through the umbilical port site. The liver bed was irrigated and inspected. Hemostasis was achieved with the electrocautery. Copious irrigation was utilized and was repeatedly aspirated until clear. I then reinspected the small bowel knuckle that had been tethered to the anterior abdominal wall. There is no evidence of injury to the bowel wall. I irrigated the bowel in this area and reinspected it and it appeared normal. The pursestring suture was used to close the umbilical fascia.    We again inspected the right upper quadrant for hemostasis.  The umbilical closure was inspected and there was a small air leak. Therefore 2 additional interrupted 0 Vicryl sutures were placed through the umbilical fascia. nothing was trapped within the closure. Pneumoperitoneum was released as we removed the trocars.  4-0 Monocryl was used to close the skin.   Dermabond was applied. The patient was then extubated and brought to the recovery room in stable condition. Instrument, sponge, and needle counts were correct at closure and at the conclusion of the case.   Findings: Fatty liver  Estimated Blood Loss: Minimal         Drains: none         Specimens: Gallbladder           Complications: None; patient tolerated the procedure well.         Disposition: PACU - hemodynamically stable.         Condition: stable  Mary Sella. Andrey Campanile, MD, FACS General, Bariatric, &  Minimally Invasive Surgery [facility] Surgery, [place]

## 2013-04-13 NOTE — Progress Notes (Signed)
Relieved by Phyllis Gingeravid RN to talk w/director.

## 2013-04-13 NOTE — Anesthesia Procedure Notes (Addendum)
Procedure Name: Intubation Date/Time: 04/13/2013 8:26 AM Performed by: Ellin GoodieWEAVER, Krislynn Gronau M Pre-anesthesia Checklist: Patient identified, Emergency Drugs available, Suction available, Patient being monitored and Timeout performed Patient Re-evaluated:Patient Re-evaluated prior to inductionOxygen Delivery Method: Circle system utilized Preoxygenation: Pre-oxygenation with 100% oxygen Intubation Type: IV induction Ventilation: Mask ventilation without difficulty Laryngoscope Size: Mac and 3 Grade View: Grade I Tube type: Oral Number of attempts: 1 Airway Equipment and Method: Stylet Placement Confirmation: ETT inserted through vocal cords under direct vision,  positive ETCO2 and breath sounds checked- equal and bilateral Secured at: 21 cm Tube secured with: Tape Dental Injury: Teeth and Oropharynx as per pre-operative assessment  Comments: Easy atraumatic induction and intubation with MAC 3 blade.  Dr. Jacklynn BueMassagee verified placement of ETT.  Carlynn HeraldH Weaver, CRNA

## 2013-04-13 NOTE — H&P (View-Only) (Signed)
Patient ID: Cassidy Heightatricia Imel, female   DOB: 06-Dec-1970, 43 y.o.   MRN: [id number]  Chief Complaint  Patient presents with  . Abdominal Pain    galllstone    HPI Cassidy Sanchez is a 43 y.o. female. Abdominal Pain Associated symptoms: constipation, diarrhea, nausea and vomiting   Associated symptoms: no chest pain, no dysuria, no fever and no shortness of breath   43 year old female referred by Dr. Wilkie AyeHorton for evaluation of right upper quadrant abdominal pain. The patient states that she started to have some right-sided abdominal pain over a month ago. She initially thought it was gas. The pain acutely worsened and did not resolve which prompted her to go to the emergency room. In the ER that night her CT scan was normal along with her Labs. She was discharged to home and advised to come back the following day for an abdominal ultrasound which showed a small gallbladder polyp and a fatty liver. She states that evening when she had the discomfort she had nausea and then developed emesis. She's also had alternating bouts of diarrhea and constipation. She states that she still has discomfort. She states the pain is always present. She's lost about 8 pounds over the past month and a half. The nausea is controlled with Zofran. She states that she's been avoiding certain foods because she has noticed a correlation with certain foods. The pain does radiate to her back and shoulder. Stylets and dairy products worsen the pain. She reports feeling bloated and full. She denies any melena or hematochezia. She denies any fevers or chills. Her stools have been light colored  Past Medical History  Diagnosis Date  . SVT (supraventricular tachycardia)   . Hypertension     Past Surgical History  Procedure Laterality Date  . Tubal ligation    . Cesarean section    . Abdominal hysterectomy      History reviewed. No pertinent family history.  Social History History  Substance Use Topics  . Smoking status:  Never Smoker   . Smokeless tobacco: Not on file  . Alcohol Use: No    Allergies  Allergen Reactions  . Latex Hives and Itching  . Sulfa Antibiotics Itching    Current Outpatient Prescriptions  Medication Sig Dispense Refill  . HYDROcodone-acetaminophen (NORCO/VICODIN) 5-325 MG per tablet Take 2 tablets by mouth every 4 (four) hours as needed.  10 tablet  0  . metoprolol-hydrochlorothiazide (LOPRESSOR HCT) 50-25 MG per tablet Take 1 tablet by mouth daily.       . ondansetron (ZOFRAN) 4 MG tablet Take 1 tablet (4 mg total) by mouth every 6 (six) hours.  12 tablet  0   No current facility-administered medications for this visit.    Review of Systems Review of Systems  Constitutional: Negative for fever, activity change, appetite change and unexpected weight change.  HENT: Negative for nosebleeds and trouble swallowing.   Eyes: Negative for photophobia and visual disturbance.  Respiratory: Negative for chest tightness and shortness of breath.   Cardiovascular: Negative for chest pain and leg swelling.       Denies CP, SOB, orthopnea, PND, DOE  Gastrointestinal: Positive for nausea, vomiting, abdominal pain, diarrhea and constipation.  Genitourinary: Negative for dysuria and difficulty urinating.  Musculoskeletal: Negative for arthralgias.  Skin: Negative for pallor and rash.  Neurological: Negative for dizziness, seizures, facial asymmetry and numbness.       Denies TIA and amaurosis fugax   Hematological: Negative for adenopathy. Does not bruise/bleed easily.  Psychiatric/Behavioral: Negative  for behavioral problems and agitation.    Blood pressure 130/84, pulse 70, temperature 97.7 F (36.5 C), temperature source Temporal, resp. rate 18, height 5\' 2"  (1.575 m), weight 227 lb 6.4 oz (103.148 kg).  Physical Exam Physical Exam  Constitutional: She is oriented to person, place, and time. She appears well-developed and well-nourished. No distress.  HENT:  Head: Normocephalic  and atraumatic.  Right Ear: External ear normal.  Left Ear: External ear normal.  Eyes: Conjunctivae are normal. No scleral icterus.  Neck: Normal range of motion. Neck supple. No tracheal deviation present. No thyromegaly present.  Cardiovascular: Normal rate, normal heart sounds and intact distal pulses.   Pulmonary/Chest: Effort normal and breath sounds normal. No respiratory distress. She has no wheezes.  Abdominal: Soft. She exhibits no distension. There is no tenderness. There is no rebound and no guarding.    Musculoskeletal: Normal range of motion. She exhibits no edema and no tenderness.  Lymphadenopathy:    She has no cervical adenopathy.  Neurological: She is alert and oriented to person, place, and time. She exhibits normal muscle tone.  Skin: Skin is warm and dry. No rash noted. She is not diaphoretic. No erythema. No pallor.  Psychiatric: She has a normal mood and affect. Her behavior is normal. Judgment and thought content normal.    Data Reviewed 2/12 labs - normal cbc, cmet, lipase, ua 2/13 u/s - fatty liver, small gb polyp 2/12 CT - normal   Assessment    right upper quadrant pain Gallbladder polyp Fatty liver     Plan    Her symptoms are suggestive of biliary colic. She denies any NSAID use. This does not sound like peptic ulcer disease. We discussed obtaining a nuclear medicine scan of her gallbladder however even if that is normal still think her gallbladder as the likely culprit of her symptoms. Therefore I offered her a cholecystectomy.  I believe the patient's symptoms are probably consistent with gallbladder disease.  We discussed gallbladder disease. The patient was given Agricultural engineer. We discussed non-operative and operative management. We discussed the signs & symptoms of acute cholecystitis  I discussed laparoscopic cholecystectomy with IOC in detail.  The patient was given educational material as well as diagrams detailing the procedure.  We  discussed the risks and benefits of a laparoscopic cholecystectomy including, but not limited to bleeding, infection, injury to surrounding structures such as the intestine or liver, bile leak, retained gallstones, need to convert to an open procedure, prolonged diarrhea, blood clots such as  DVT, common bile duct injury, anesthesia risks, and possible need for additional procedures.  We discussed the typical post-operative recovery course. I explained that the likelihood of improvement of their symptoms is fair..  She has elected to proceed to surgery. She will meet with surgery schedulers today to schedule surgery.  Mary Sella. Andrey Campanile, MD, FACS General, Bariatric, & Minimally Invasive Surgery [facility], [place]       [facility] M 04/01/2013, 4:40 PM

## 2013-04-13 NOTE — Progress Notes (Signed)
Returning to bedside.

## 2013-04-13 NOTE — Anesthesia Postprocedure Evaluation (Signed)
  Anesthesia Post-op Note  Patient: Cassidy Sanchez Heightatricia Zamorano  Procedure(s) Performed: Procedure(s): LAPAROSCOPIC CHOLECYSTECTOMY (N/A)  Patient Location: PACU  Anesthesia Type:General  Level of Consciousness: awake and alert   Airway and Oxygen Therapy: Patient Spontanous Breathing  Post-op Pain: mild  Post-op Assessment: Post-op Vital signs reviewed  Post-op Vital Signs: stable  Last Vitals:  Filed Vitals:   04/13/13 0957  BP:   Pulse:   Temp: 36.7 C  Resp:     Complications: No apparent anesthesia complications

## 2013-04-13 NOTE — Discharge Instructions (Signed)
[facility] °LAPAROSCOPIC SURGERY: POST OP INSTRUCTIONS °Always review your discharge instruction sheet given to you by the facility where your surgery was performed. °IF YOU HAVE DISABILITY OR FAMILY LEAVE FORMS, YOU MUST BRING THEM TO THE OFFICE FOR PROCESSING.   °DO NOT GIVE THEM TO YOUR DOCTOR. ° °1. A prescription for pain medication may be given to you upon discharge.  Take your pain medication as prescribed, if needed.  If narcotic pain medicine is not needed, then you may take acetaminophen (Tylenol) or ibuprofen (Advil) as needed. °2. Take your usually prescribed medications unless otherwise directed. °3. If you need a refill on your pain medication, please contact your pharmacy.  They will contact our office to request authorization. Prescriptions will not be filled after 5pm or on week-ends. °4. You should follow a light diet the first few days after arrival home, such as soup and crackers, etc.  Be sure to include lots of fluids daily. °5. Most patients will experience some swelling and bruising in the area of the incisions.  Ice packs will help.  Swelling and bruising can take several days to resolve.  °6. It is common to experience some constipation if taking pain medication after surgery.  Increasing fluid intake and taking a stool softener (such as Colace) will usually help or prevent this problem from occurring.  A mild laxative (Milk of Magnesia or Miralax) should be taken according to package instructions if there are no bowel movements after 48 hours. °7. Unless discharge instructions indicate otherwise, you may remove your bandages 24-48 hours after surgery, and you may shower at that time.  You may have steri-strips (small skin tapes) in place directly over the incision.  These strips should be left on the skin for 7-10 days.  If your surgeon used skin glue on the incision, you may shower in 24 hours.  The glue will flake off over the next 2-3 weeks.  Any sutures or  staples will be removed at the office during your follow-up visit. °8. ACTIVITIES:  You may resume regular (light) daily activities beginning the next day--such as daily self-care, walking, climbing stairs--gradually increasing activities as tolerated.  You may have sexual intercourse when it is comfortable.  Refrain from any heavy lifting or straining until approved by your doctor. °a. You may drive when you are no longer taking prescription pain medication, you can comfortably wear a seatbelt, and you can safely maneuver your car and apply brakes. °9. You should see your doctor in the office for a follow-up appointment approximately 2-3 weeks after your surgery.  Make sure that you call for this appointment within a day or two after you arrive home to insure a convenient appointment time. °10. OTHER INSTRUCTIONS:  °WHEN TO CALL YOUR DOCTOR: °1. Fever over 101.0 °2. Inability to urinate °3. Continued bleeding from incision. °4. Increased pain, redness, or drainage from the incision. °5. Increasing abdominal pain ° °The clinic staff is available to answer your questions during regular business hours.  Please don’t hesitate to call and ask to speak to one of the nurses for clinical concerns.  If you have a medical emergency, go to the nearest emergency room or call 911.  A surgeon from [facility] is always on call at the hospital. °[address], [address], [place], [place]  [address] ? [address] [address], , [place]   [address] °[phone] ? [phone] ? FAX [phone] °Web site: [email or web address] ° ° °What to eat: ° °  For your first meals, you should eat lightly; only small meals initially.  If you do not have nausea, you may eat larger meals.  Avoid spicy, greasy and heavy food.   ° °General Anesthesia, Adult, Care After  °Refer to this sheet in the next few weeks. These instructions provide you with information on caring for yourself after your procedure. Your health care  provider may also give you more specific instructions. Your treatment has been planned according to current medical practices, but problems sometimes occur. Call your health care provider if you have any problems or questions after your procedure.  °WHAT TO EXPECT AFTER THE PROCEDURE  °After the procedure, it is typical to experience:  °Sleepiness.  °Nausea and vomiting. °HOME CARE INSTRUCTIONS  °For the first 24 hours after general anesthesia:  °Have a responsible person with you.  °Do not drive a car. If you are alone, do not take public transportation.  °Do not drink alcohol.  °Do not take medicine that has not been prescribed by your health care provider.  °Do not sign important papers or make important decisions.  °You may resume a normal diet and activities as directed by your health care provider.  °Change bandages (dressings) as directed.  °If you have questions or problems that seem related to general anesthesia, call the hospital and ask for the anesthetist or anesthesiologist on call. °SEEK MEDICAL CARE IF:  °You have nausea and vomiting that continue the day after anesthesia.  °You develop a rash. °SEEK IMMEDIATE MEDICAL CARE IF:  °You have difficulty breathing.  °You have chest pain.  °You have any allergic problems. °Document Released: 03/30/2000 Document Revised: 08/24/2012 Document Reviewed: 07/07/2012  °ExitCare® Patient Information ©2014 [employer].  ° ° °

## 2013-04-13 NOTE — Transfer of Care (Signed)
Immediate Anesthesia Transfer of Care Note  Patient: Cassidy Sanchez  Procedure(s) Performed: Procedure(s): LAPAROSCOPIC CHOLECYSTECTOMY (N/A)  Patient Location: PACU  Anesthesia Type:General  Level of Consciousness: awake, alert  and oriented  Airway & Oxygen Therapy: Patient connected to face mask oxygen  Post-op Assessment: Report given to PACU RN  Post vital signs: stable  Complications: No apparent anesthesia complications

## 2013-04-13 NOTE — Progress Notes (Signed)
Report to Onalee Huaavid RN as relief RN

## 2013-04-13 NOTE — Interval H&P Note (Signed)
History and Physical Interval Note:  04/13/2013 7:59 AM  Cassidy Sanchez HeightPatricia Godwin  has presented today for surgery, with the diagnosis of gall stone   The various methods of treatment have been discussed with the patient and family. After consideration of risks, benefits and other options for treatment, the patient has consented to  Procedure(s): LAPAROSCOPIC CHOLECYSTECTOMY (N/A) as a surgical intervention .  The patient's history has been reviewed, patient examined, no change in status, stable for surgery.  I have reviewed the patient's chart and labs.  Questions were answered to the patient's satisfaction.    Mary SellaEric M. Andrey CampanileWilson, MD, FACS General, Bariatric, & Minimally Invasive Surgery [facility] [place]   Atilano InaEric M Hilliary Jock

## 2013-04-14 ENCOUNTER — Telehealth (INDEPENDENT_AMBULATORY_CARE_PROVIDER_SITE_OTHER): Payer: Self-pay | Admitting: General Surgery

## 2013-04-14 NOTE — Telephone Encounter (Signed)
She had a laparoscopic cholecystectomy yesterday by Dr. Andrey CampanileWilson. She called because she had temperature of 99.1, she is coughing up some yellow phlegm, and her periumbilical incision looked a little red. I told her none of these things were too unusual.  I recommended she placed an ice pack around the periumbilical wound is likely this was the start of a bruise. I told her that if the redness spread and she had a fever greater that 101 degrees to give us a call.

## 2013-04-17 ENCOUNTER — Telehealth (INDEPENDENT_AMBULATORY_CARE_PROVIDER_SITE_OTHER): Payer: Self-pay | Admitting: General Surgery

## 2013-04-17 ENCOUNTER — Encounter (INDEPENDENT_AMBULATORY_CARE_PROVIDER_SITE_OTHER): Payer: Self-pay | Admitting: General Surgery

## 2013-04-17 ENCOUNTER — Encounter (HOSPITAL_COMMUNITY): Payer: Self-pay | Admitting: General Surgery

## 2013-04-17 NOTE — Telephone Encounter (Signed)
Pt called for RTW letter; had lap chole on 04/13/13.  FAX letter to [phone].  Done.

## 2013-04-27 ENCOUNTER — Ambulatory Visit (INDEPENDENT_AMBULATORY_CARE_PROVIDER_SITE_OTHER): Payer: 59 | Admitting: General Surgery

## 2013-04-27 ENCOUNTER — Encounter (INDEPENDENT_AMBULATORY_CARE_PROVIDER_SITE_OTHER): Payer: Self-pay | Admitting: General Surgery

## 2013-04-27 VITALS — BP 130/78 | HR 74 | Temp 98.0°F | Resp 14 | Ht 63.0 in | Wt 222.8 lb

## 2013-04-27 DIAGNOSIS — Z09 Encounter for follow-up examination after completed treatment for conditions other than malignant neoplasm: Secondary | ICD-10-CM

## 2013-04-27 MED ORDER — TRAMADOL HCL 50 MG PO TABS: 50.0000 mg | ORAL_TABLET | Freq: Four times a day (QID) | ORAL | Status: AC | PRN

## 2013-04-27 NOTE — Progress Notes (Signed)
Subjective:     Patient ID: Cassidy Sanchez, female   DOB: 1970/07/11, 43 y.o.   MRN: [id number]  HPI 43 year old [place] female comes in for followup after undergoing laparoscopic cholecystectomy on April 9 for right upper quadrant and epigastric pain. She states that the symptoms she had preoperatively have resolved. She denies any fevers or chills. She states that she still pretty tender at her umbilicus. She states that the Dermabond peeled off very quickly around her umbilical incision. She reports a decent appetite. She denies any diarrhea or constipation. She reports normal urination.  Review of Systems     Objective:   Physical Exam BP 130/78  Pulse 74  Temp(Src) 98 F (36.7 C)  Resp 14  Ht 5\' 3"  (1.6 m)  Wt 222 lb 12.8 oz (101.061 kg)  BMI 39.48 kg/m2  Gen: alert, NAD, non-toxic appearing Pupils: equal, no scleral icterus Pulm: Lungs clear to auscultation, symmetric chest rise CV: regular rate and rhythm Abd: soft, nontender, nondistended. Well-healed trocar sites. No cellulitis. No incisional hernia Ext: no edema,  Skin: no rash, no jaundice     Assessment:     S/p lap cholecystectomy     Plan:     I reviewed the surgery with her. We discussed that she did have some small bowel adhered to her lower abdominal midline however it did not appear to be causing any problems. I did explain that I placed 3 additional sutures at her umbilical closure sites as probably while she is still tender at the area. She was given a copy of her pathology report which demonstrated chronic cholecystitis and cholesterolosis. I gave her prescription for Ultram for the periumbilical pain. I've asked her to contact the office after 6 weeks if her pain does not resolve. Otherwise follow up as needed  Mary Sellaric M. Andrey CampanileWilson, MD, FACS General, Bariatric, & Minimally Invasive Surgery [facility] [place]

## 2013-04-27 NOTE — Patient Instructions (Signed)
Can take motrin or tylenol during the daytime. Do not take more than 4000mg  (4grams) of tylenol per day  If the soreness doesn't resolved after 6 weeks call me

## 2013-05-16 ENCOUNTER — Encounter: Payer: Self-pay | Admitting: Cardiology

## 2013-07-13 ENCOUNTER — Encounter (HOSPITAL_BASED_OUTPATIENT_CLINIC_OR_DEPARTMENT_OTHER): Payer: Self-pay | Admitting: Emergency Medicine

## 2013-07-13 ENCOUNTER — Emergency Department (HOSPITAL_BASED_OUTPATIENT_CLINIC_OR_DEPARTMENT_OTHER)
Admission: EM | Admit: 2013-07-13 | Discharge: 2013-07-13 | Disposition: A | Discharge status: Home / Self Care | Payer: No Typology Code available for payment source | Attending: Emergency Medicine | Admitting: Emergency Medicine

## 2013-07-13 DIAGNOSIS — R5383 Other fatigue: Secondary | ICD-10-CM

## 2013-07-13 DIAGNOSIS — Z8619 Personal history of other infectious and parasitic diseases: Secondary | ICD-10-CM | POA: Insufficient documentation

## 2013-07-13 DIAGNOSIS — R079 Chest pain, unspecified: Secondary | ICD-10-CM | POA: Insufficient documentation

## 2013-07-13 DIAGNOSIS — Z79899 Other long term (current) drug therapy: Secondary | ICD-10-CM | POA: Insufficient documentation

## 2013-07-13 DIAGNOSIS — E876 Hypokalemia: Secondary | ICD-10-CM | POA: Insufficient documentation

## 2013-07-13 DIAGNOSIS — R002 Palpitations: Secondary | ICD-10-CM | POA: Insufficient documentation

## 2013-07-13 DIAGNOSIS — Z91040 Latex allergy status: Secondary | ICD-10-CM | POA: Insufficient documentation

## 2013-07-13 DIAGNOSIS — R5381 Other malaise: Secondary | ICD-10-CM | POA: Insufficient documentation

## 2013-07-13 DIAGNOSIS — R42 Dizziness and giddiness: Secondary | ICD-10-CM | POA: Insufficient documentation

## 2013-07-13 DIAGNOSIS — I498 Other specified cardiac arrhythmias: Secondary | ICD-10-CM | POA: Insufficient documentation

## 2013-07-13 DIAGNOSIS — I10 Essential (primary) hypertension: Secondary | ICD-10-CM | POA: Insufficient documentation

## 2013-07-13 LAB — CBC WITH DIFFERENTIAL/PLATELET
BASOS ABS: 0 10*3/uL (ref 0.0–0.1)
BASOS PCT: 0 % (ref 0–1)
EOS PCT: 1 % (ref 0–5)
Eosinophils Absolute: 0.1 10*3/uL (ref 0.0–0.7)
HCT: 39.7 % (ref 36.0–46.0)
Hemoglobin: 13.6 g/dL (ref 12.0–15.0)
LYMPHS PCT: 39 % (ref 12–46)
Lymphs Abs: 4.4 10*3/uL — ABNORMAL HIGH (ref 0.7–4.0)
MCH: 30.1 pg (ref 26.0–34.0)
MCHC: 34.3 g/dL (ref 30.0–36.0)
MCV: 87.8 fL (ref 78.0–100.0)
Monocytes Absolute: 0.7 10*3/uL (ref 0.1–1.0)
Monocytes Relative: 7 % (ref 3–12)
Neutro Abs: 6.2 10*3/uL (ref 1.7–7.7)
Neutrophils Relative %: 54 % (ref 43–77)
PLATELETS: 342 10*3/uL (ref 150–400)
RBC: 4.52 MIL/uL (ref 3.87–5.11)
RDW: 12.2 % (ref 11.5–15.5)
WBC: 11.5 10*3/uL — ABNORMAL HIGH (ref 4.0–10.5)

## 2013-07-13 LAB — BASIC METABOLIC PANEL
ANION GAP: 13 (ref 5–15)
BUN: 11 mg/dL (ref 6–23)
CALCIUM: 10.2 mg/dL (ref 8.4–10.5)
CO2: 30 mEq/L (ref 19–32)
CREATININE: 0.7 mg/dL (ref 0.50–1.10)
Chloride: 98 mEq/L (ref 96–112)
GFR calc Af Amer: >90 mL/min (ref >90)
Glucose, Bld: 102 mg/dL — ABNORMAL HIGH (ref 70–99)
Potassium: 3 mEq/L — ABNORMAL LOW (ref 3.7–5.3)
SODIUM: 141 meq/L (ref 137–147)

## 2013-07-13 LAB — TROPONIN I

## 2013-07-13 MED ORDER — POTASSIUM CHLORIDE CRYS ER 20 MEQ PO TBCR
40.0000 meq | EXTENDED_RELEASE_TABLET | Freq: Once | ORAL | Status: AC
Start: 2013-07-13 — End: 2013-07-13
  Administered 2013-07-13: 40 meq via ORAL
  Filled 2013-07-13: qty 2

## 2013-07-13 NOTE — ED Provider Notes (Signed)
CSN: [id number]     Arrival date & time 07/13/13  1835 History  This chart was scribed for Hurman Horn, MD by Chestine Spore, ED Scribe. The patient was seen in room MH12/MH12 at 7:59 PM.        Chief Complaint  Patient presents with  . Chest Pain    The history is provided by the patient. No language interpreter was used.   HPI Comments: Cassidy Sanchez is a 43 y.o. female who presents to the Emergency Department complaining of CP onset 2 days. She describes the pain as tight, with her heart racing. She states that she has associated symptoms of lightheaded, general weakness, and general fatigue. She states that she is generally healthy. She states that she had a heart monitor test that she wore for 2 weeks in May that showed to be unremarkable. She states that during that time she had several heart racing symptom spells but no dysrhythmias recorded despite symptoms.   She states that she will have palpitations for the past couple of years. She states that her palpitations are pounding racing and discomfort. She states that she will have the palpitations generally 3-5 times a month. She states that the episodes that she has the spells will last from minutes to over a hour. She states that the symptoms seem to happen more when she is stressed. She states that normal daily activity does not bring on the symptoms. She states that they happen day or night whether she is resting or walking. She states that in the last 24 hours she has had 3 episodes that have lasted for a hour each time. She denies fever, cough, SOB, and numbness. She states that she has not fainted. She states that she is not currently taking estrogen. She states that she has no medical history of CA. Pain free and no palpitations in ED.     Past Medical History  Diagnosis Date  . SVT (supraventricular tachycardia)     takes Lopressor daily  . Hypertension   . PONV (postoperative nausea and vomiting)   . Family history of  anesthesia complication     mom nausea and vomited;confusion  . Headache(784.0)     sinus  . Seasonal allergies     takes Zyrtec D as needed  . Joint swelling     right ankle  . History of shingles    Past Surgical History  Procedure Laterality Date  . Tubal ligation    . Cesarean section    . Tonsillectomy    . Uvula removed    . Nasal septum surgery    . Vaginal hysterectomy    . Ankle surgery Right   . Cholecystectomy N/A 04/13/2013    Procedure: LAPAROSCOPIC CHOLECYSTECTOMY;  Surgeon: Atilano Ina, MD;  Location: [facility] OR;  Service: General;  Laterality: N/A;  . Cholecystectomy     No family history on file. History  Substance Use Topics  . Smoking status: Never Smoker   . Smokeless tobacco: Not on file  . Alcohol Use: No   OB History   Grav Para Term Preterm Abortions TAB SAB Ect Mult Living   2 2        2      Review of Systems  Constitutional: Positive for fatigue. Negative for fever.  Respiratory: Negative for cough and shortness of breath.   Cardiovascular: Positive for chest pain.  Neurological: Positive for weakness and light-headedness. Negative for numbness.    10 Systems reviewed and  are negative for acute change except as noted in the HPI.   Allergies  Erythromycin; Latex; and Sulfa antibiotics  Home Medications   Prior to Admission medications   Medication Sig Start Date End Date Taking? Authorizing Provider  benazepril-hydrochlorthiazide (LOTENSIN HCT) 10-12.5 MG per tablet Take 1 tablet by mouth daily.   Yes Historical Provider, MD  acetaminophen (TYLENOL) 500 MG tablet Take 500 mg by mouth every 6 (six) hours as needed.    Historical Provider, MD  metoprolol-hydrochlorothiazide (LOPRESSOR HCT) 50-25 MG per tablet Take 1 tablet by mouth daily.  02/09/13   Historical Provider, MD  traMADol (ULTRAM) 50 MG tablet Take 1 tablet (50 mg total) by mouth every 6 (six) hours as needed. 04/27/13 04/27/14  Atilano Ina, MD  valACYclovir (VALTREX) 500 MG tablet  Take 500 mg by mouth 2 (two) times daily.    Historical Provider, MD   BP 164/100  Pulse 88  Temp(Src) 98.8 F (37.1 C) (Oral)  Resp 18  Ht 5\' 3"  (1.6 m)  Wt 225 lb (102.059 kg)  BMI 39.87 kg/m2  SpO2 98%  Physical Exam  Nursing note and vitals reviewed. Constitutional:  Awake, alert, nontoxic appearance.  HENT:  Head: Atraumatic.  Eyes: Right eye exhibits no discharge. Left eye exhibits no discharge.  Neck: Neck supple.  Cardiovascular: Normal rate and regular rhythm.   No murmur heard. Pulmonary/Chest: Effort normal and breath sounds normal. No respiratory distress. She has no wheezes. She has no rales. She exhibits no tenderness.  Abdominal: Soft. There is no tenderness. There is no rebound.  Musculoskeletal: She exhibits no tenderness.  Baseline ROM, no obvious new focal weakness.  Neurological:  Mental status and motor strength appears baseline for patient and situation.  Skin: No rash noted.  Psychiatric: She has a normal mood and affect.    ED Course  Procedures (including critical care time) DIAGNOSTIC STUDIES: Oxygen Saturation is 98% on room air, normal by my interpretation.    COORDINATION OF CARE: 8:09 PM-Discussed treatment plan which includes labs and EKG with pt at bedside and pt agreed to plan.   Patient understand and agree with initial ED impression and plan with expectations set for ED visit. Patient / Family / Caregiver informed of clinical course, understand medical decision-making process, and agree with plan. Labs Review Labs Reviewed  BASIC METABOLIC PANEL - Abnormal; Notable for the following:    Potassium 3.0 (*)    Glucose, Bld 102 (*)    All other components within normal limits  CBC WITH DIFFERENTIAL - Abnormal; Notable for the following:    WBC 11.5 (*)    Lymphs Abs 4.4 (*)    All other components within normal limits  TROPONIN I  TROPONIN I    Imaging Review No results found.   EKG Interpretation   Date/Time:  Thursday July 13 2013 18:51:14 EDT Ventricular Rate:  85 PR Interval:  146 QRS Duration: 78 QT Interval:  370 QTC Calculation: 440 R Axis:   75 Text Interpretation:  Normal sinus rhythm Nonspecific T wave abnormality  No significant change since last tracing Confirmed by [facility]  MD, Jonny Ruiz  [phone]) on 07/13/2013 7:13:36 PM    PERC negative. Heart Score 1.  MDM   Final diagnoses:  Palpitations  Chest pain, unspecified chest pain type  Hypokalemia    I doubt any other EMC precluding discharge at this time including, but not necessarily limited to the following:PE, AMI, Vtach.  I personally performed the services described in  this documentation, which was scribed in my presence. The recorded information has been reviewed and is accurate.    Hurman HornJohn M Arvin Abello, MD 07/14/13 1134

## 2013-07-13 NOTE — ED Notes (Signed)
States having chest pressure and feels like heart racing onset yesterday, no n/v no sob,

## 2013-07-13 NOTE — ED Notes (Signed)
Chest tightness,  Heart racing,  Fatigue,  Body build up x 2 days

## 2013-07-13 NOTE — Discharge Instructions (Signed)

## 2013-11-06 ENCOUNTER — Encounter (HOSPITAL_BASED_OUTPATIENT_CLINIC_OR_DEPARTMENT_OTHER): Payer: Self-pay | Admitting: Emergency Medicine

## 2014-12-17 ENCOUNTER — Encounter (HOSPITAL_COMMUNITY): Payer: Self-pay | Admitting: Emergency Medicine

## 2014-12-17 ENCOUNTER — Emergency Department (HOSPITAL_COMMUNITY)
Admission: EM | Admit: 2014-12-17 | Discharge: 2014-12-18 | Disposition: A | Discharge status: Home / Self Care | Payer: 59 | Attending: Emergency Medicine | Admitting: Emergency Medicine

## 2014-12-17 DIAGNOSIS — Z91040 Latex allergy status: Secondary | ICD-10-CM | POA: Insufficient documentation

## 2014-12-17 DIAGNOSIS — Z8619 Personal history of other infectious and parasitic diseases: Secondary | ICD-10-CM | POA: Diagnosis not present

## 2014-12-17 DIAGNOSIS — H9209 Otalgia, unspecified ear: Secondary | ICD-10-CM | POA: Diagnosis not present

## 2014-12-17 DIAGNOSIS — R040 Epistaxis: Secondary | ICD-10-CM | POA: Diagnosis not present

## 2014-12-17 DIAGNOSIS — Z79899 Other long term (current) drug therapy: Secondary | ICD-10-CM | POA: Insufficient documentation

## 2014-12-17 DIAGNOSIS — I471 Supraventricular tachycardia: Secondary | ICD-10-CM | POA: Insufficient documentation

## 2014-12-17 DIAGNOSIS — Z7951 Long term (current) use of inhaled steroids: Secondary | ICD-10-CM | POA: Diagnosis not present

## 2014-12-17 DIAGNOSIS — J0101 Acute recurrent maxillary sinusitis: Secondary | ICD-10-CM | POA: Insufficient documentation

## 2014-12-17 DIAGNOSIS — I10 Essential (primary) hypertension: Secondary | ICD-10-CM | POA: Insufficient documentation

## 2014-12-17 MED ORDER — OXYMETAZOLINE HCL 0.05 % NA SOLN
1.0000 | Freq: Once | NASAL | Status: AC
  Administered 2014-12-17: 1 via NASAL
  Filled 2014-12-17: qty 15

## 2014-12-17 MED ORDER — AMOXICILLIN 500 MG PO CAPS
1000.0000 mg | ORAL_CAPSULE | Freq: Once | ORAL | Status: AC
  Administered 2014-12-17: 1000 mg via ORAL
  Filled 2014-12-17: qty 2

## 2014-12-17 MED ORDER — FLUCONAZOLE 150 MG PO TABS: 150.0000 mg | ORAL_TABLET | Freq: Once | ORAL | Status: AC

## 2014-12-17 MED ORDER — AMOXICILLIN 500 MG PO CAPS: 1000.0000 mg | ORAL_CAPSULE | Freq: Two times a day (BID) | ORAL | Status: DC

## 2014-12-17 NOTE — ED Notes (Signed)
Patient states amoxicillin typically gives her a yeast infection, PA notified for Rx of Diflucan.

## 2014-12-17 NOTE — ED Provider Notes (Signed)
CSN: [id number]     Arrival date & time 12/17/14  2037 History  By signing my name below, I, Budd Palmer, attest that this documentation has been prepared under the direction and in the presence of [employer], PA-C. Electronically Signed: Budd Palmer, ED Scribe. 12/17/2014. 11:37 PM.    Chief Complaint  Patient presents with  . Epistaxis   The history is provided by the patient. No language interpreter was used.   HPI Comments: Cassidy Sanchez is a 44 y.o. female with a PMHx of HTN, SVT, and seasonal allergies who presents to the Emergency Department complaining of intermittent left-sided epistaxis onset this afternoon. She reports associated burning left nostril pain, congestion, dental pain, ear pain, fatigue, and generalized myalgias. She notes she woke up yesterday with cold-like symptoms which progressively worsened. She notes she is on medication for SVT and notes that she still felt unwell after taking her metoprolol and hydrochlorothiazide today. She notes that the bleeding resolved after receiving a dose of Afrin in the ED. Pt denies fever.   Past Medical History  Diagnosis Date  . SVT (supraventricular tachycardia) (HCC)     takes Lopressor daily  . Hypertension   . PONV (postoperative nausea and vomiting)   . Family history of anesthesia complication     mom nausea and vomited;confusion  . Headache(784.0)     sinus  . Seasonal allergies     takes Zyrtec D as needed  . Joint swelling     right ankle  . History of shingles    Past Surgical History  Procedure Laterality Date  . Tubal ligation    . Cesarean section    . Tonsillectomy    . Uvula removed    . Nasal septum surgery    . Vaginal hysterectomy    . Ankle surgery Right   . Cholecystectomy N/A 04/13/2013    Procedure: LAPAROSCOPIC CHOLECYSTECTOMY;  Surgeon: Atilano Ina, MD;  Location: [facility] OR;  Service: General;  Laterality: N/A;  . Cholecystectomy     No family history on file. Social History   Substance Use Topics  . Smoking status: Never Smoker   . Smokeless tobacco: None  . Alcohol Use: No   OB History    Gravida Para Term Preterm AB TAB SAB Ectopic Multiple Living   Review of Systems  Constitutional: Positive for fatigue. Negative for fever.  HENT: Positive for congestion, dental problem, ear pain, nosebleeds and sinus pressure. Negative for trouble swallowing.   Respiratory: Negative for cough and shortness of breath.   Gastrointestinal: Negative for nausea.  Musculoskeletal: Positive for myalgias. Negative for neck stiffness.    Allergies  Erythromycin; Latex; Sulfa antibiotics; and Adhesive  Home Medications   Prior to Admission medications   Medication Sig Start Date End Date Taking? Authorizing Provider  acetaminophen (TYLENOL) 500 MG tablet Take 500 mg by mouth every 6 (six) hours as needed.   Yes Historical Provider, MD  fluticasone (FLONASE) 50 MCG/ACT nasal spray Place 1 spray into both nostrils daily.   Yes Historical Provider, MD  hydrochlorothiazide (HYDRODIURIL) 25 MG tablet Take 25 mg by mouth daily. 11/16/14  Yes Historical Provider, MD  metoprolol succinate (TOPROL-XL) 100 MG 24 hr tablet Take 100 mg by mouth every morning. 09/13/14  Yes Historical Provider, MD   BP 162/103 mmHg  Pulse 101  Temp(Src) 98.1 F (36.7 C) (Oral)  Resp 20  SpO2 98% Physical  Exam  Constitutional: She is oriented to person, place, and time. She appears well-developed and well-nourished. No distress.  HENT:  Head: Normocephalic and atraumatic.  Nose: Mucosal edema present. Right sinus exhibits maxillary sinus tenderness. Left sinus exhibits maxillary sinus tenderness.  Mouth/Throat: Mucous membranes are not dry.  Source of epistaxis visualized in left anterior nostril without active bleeding.   Eyes: Conjunctivae are normal. Right eye exhibits no discharge. Left eye exhibits no discharge. No scleral icterus.  Neck: Normal range of motion.   Cardiovascular: Normal rate.   Pulmonary/Chest: Effort normal. She has no wheezes. She has no rales.  Lymphadenopathy:    She has no cervical adenopathy.  Neurological: She is alert and oriented to person, place, and time. Coordination normal.  Skin: Skin is warm and dry. No rash noted. She is not diaphoretic. No erythema. No pallor.  Psychiatric: She has a normal mood and affect. Her behavior is normal.  Nursing note and vitals reviewed.   ED Course  Procedures  DIAGNOSTIC STUDIES: Oxygen Saturation is 98% on RA, normal by my interpretation.    COORDINATION OF CARE: 11:36 PM - Discussed plans to order Afrin and advised to use only with epistaxis and no more than 3 days in a row. Will order antibiotics. Pt advised of plan for treatment and pt agrees.  Labs Review Labs Reviewed - No data to display  Imaging Review No results found. I have personally reviewed and evaluated these images and lab results as part of my medical decision-making.   EKG Interpretation None      MDM   Final diagnoses:  None    1. Sinusitis 2. Epistaxis  No need to cauterize or pack without continued bleeding of left nostril. Recommended Afrin with caution. Discussed supportive care and antibiotics, with PCP follow up as needed.  I personally performed the services described in this documentation, which was scribed in my presence. The recorded information has been reviewed and is accurate.     Elpidio AnisShari Onesimo Lingard, PA-C 12/17/14 2347  Paula LibraJohn Molpus, MD 12/18/14 [phone]

## 2014-12-17 NOTE — Discharge Instructions (Signed)
USE AFRIN FOR RECURRENT NOSEBLEEDING FOR MAXIMUM OF 3 CONSECUTIVE DAYS. USE YOUR ZYRTEC DAILY. TAKE AMOXIL AS PRESCRIBED. RETURN HERE AS NEEDED.   Sinusitis, Adult Sinusitis is redness, soreness, and inflammation of the paranasal sinuses. Paranasal sinuses are air pockets within the bones of your face. They are located beneath your eyes, in the middle of your forehead, and above your eyes. In healthy paranasal sinuses, mucus is able to drain out, and air is able to circulate through them by way of your nose. However, when your paranasal sinuses are inflamed, mucus and air can become trapped. This can allow bacteria and other germs to grow and cause infection. Sinusitis can develop quickly and last only a short time (acute) or continue over a long period (chronic). Sinusitis that lasts for more than 12 weeks is considered chronic. CAUSES Causes of sinusitis include:  Allergies.  Structural abnormalities, such as displacement of the cartilage that separates your nostrils (deviated septum), which can decrease the air flow through your nose and sinuses and affect sinus drainage.  Functional abnormalities, such as when the small hairs (cilia) that line your sinuses and help remove mucus do not work properly or are not present. SIGNS AND SYMPTOMS Symptoms of acute and chronic sinusitis are the same. The primary symptoms are pain and pressure around the affected sinuses. Other symptoms include:  Upper toothache.  Earache.  Headache.  Bad breath.  Decreased sense of smell and taste.  A cough, which worsens when you are lying flat.  Fatigue.  Fever.  Thick drainage from your nose, which often is green and may contain pus (purulent).  Swelling and warmth over the affected sinuses. DIAGNOSIS Your health care provider will perform a physical exam. During your exam, your health care provider may perform any of the following to help determine if you have acute sinusitis or chronic  sinusitis:  Look in your nose for signs of abnormal growths in your nostrils (nasal polyps).  Tap over the affected sinus to check for signs of infection.  View the inside of your sinuses using an imaging device that has a light attached (endoscope). If your health care provider suspects that you have chronic sinusitis, one or more of the following tests may be recommended:  Allergy tests.  Nasal culture. A sample of mucus is taken from your nose, sent to a lab, and screened for bacteria.  Nasal cytology. A sample of mucus is taken from your nose and examined by your health care provider to determine if your sinusitis is related to an allergy. TREATMENT Most cases of acute sinusitis are related to a viral infection and will resolve on their own within 10 days. Sometimes, medicines are prescribed to help relieve symptoms of both acute and chronic sinusitis. These may include pain medicines, decongestants, nasal steroid sprays, or saline sprays. However, for sinusitis related to a bacterial infection, your health care provider will prescribe antibiotic medicines. These are medicines that will help kill the bacteria causing the infection. Rarely, sinusitis is caused by a fungal infection. In these cases, your health care provider will prescribe antifungal medicine. For some cases of chronic sinusitis, surgery is needed. Generally, these are cases in which sinusitis recurs more than 3 times per year, despite other treatments. HOME CARE INSTRUCTIONS  Drink plenty of water. Water helps thin the mucus so your sinuses can drain more easily.  Use a humidifier.  Inhale steam 3-4 times a day (for example, sit in the bathroom with the shower running).  Apply a  warm, moist washcloth to your face 3-4 times a day, or as directed by your health care provider.  Use saline nasal sprays to help moisten and clean your sinuses.  Take medicines only as directed by your health care provider.  If you were  prescribed either an antibiotic or antifungal medicine, finish it all even if you start to feel better. SEEK IMMEDIATE MEDICAL CARE IF:  You have increasing pain or severe headaches.  You have nausea, vomiting, or drowsiness.  You have swelling around your face.  You have vision problems.  You have a stiff neck.  You have difficulty breathing.   This information is not intended to replace advice given to you by your health care provider. Make sure you discuss any questions you have with your health care provider.   Document Released: 12/22/2004 Document Revised: 01/12/2014 Document Reviewed: 01/06/2011 Elsevier Interactive Patient Education 2016 [employer].  Nosebleed Nosebleeds are common. A nosebleed can be caused by many things, including:  Getting hit hard in the nose.  Infections.  Dryness in your nose.  A dry climate.  Medicines.  Picking your nose.  Your home heating and cooling systems. HOME CARE   Try controlling your nosebleed by pinching your nostrils gently. Do this for at least 10 minutes.  Avoid blowing or sniffing your nose for a number of hours after having a nosebleed.  Do not put gauze inside of your nose yourself. If your nose was packed by your doctor, try to keep the pack inside of your nose until your doctor removes it.  If a gauze pack was used and it starts to fall out, gently replace it or cut off the end of it.  If a balloon catheter was used to pack your nose, do not cut or remove it unless told by your doctor.  Avoid lying down while you are having a nosebleed. Sit up and lean forward.  Use a nasal spray decongestant to help with a nosebleed as told by your doctor.  Do not use petroleum jelly or mineral oil in your nose. These can drip into your lungs.  Keep your house humid by using:  Less air conditioning.  A humidifier.  Aspirin and blood thinners make bleeding more likely. If you are prescribed these medicines and you have  nosebleeds, ask your doctor if you should stop taking the medicines or adjust the dose. Do not stop medicines unless told by your doctor.  Resume your normal activities as you are able. Avoid straining, lifting, or bending at your waist for several days.  If your nosebleed was caused by dryness in your nose, use over-the-counter saline nasal spray or gel. If you must use a lubricant:  Choose one that is water-soluble.  Use it only as needed.  Do not use it within several hours of lying down.  Keep all follow-up visits as told by your doctor. This is important. GET HELP IF:  You have a fever.  You get frequent nosebleeds.  You are getting nosebleeds more often. GET HELP RIGHT AWAY IF:  Your nosebleed lasts longer than 20 minutes.  Your nosebleed occurs after an injury to your face, and your nose looks crooked or broken.  You have unusual bleeding from other parts of your body.  You have unusual bruising on other parts of your body.  You feel light-headed or dizzy.  You become sweaty.  You throw up (vomit) blood.  You have a nosebleed after a head injury.   This information  is not intended to replace advice given to you by your health care provider. Make sure you discuss any questions you have with your health care provider.   Document Released: 10/01/2007 Document Revised: 01/12/2014 Document Reviewed: 08/07/2013 Elsevier Interactive Patient Education [employer] Elsevier Inc.

## 2014-12-17 NOTE — ED Notes (Signed)
PA at bedside.

## 2014-12-17 NOTE — ED Notes (Signed)
Patient presents for nosebleed started earlier today. Developed cold like symptoms yesterday, sneezed and started having bleeding from bilateral nares. Ambulatory in triage.   Last VS: 160 palpated, 90hr.

## 2014-12-17 NOTE — ED Notes (Signed)
Patient c/o cold like symptoms, sat up and left nostril starting bleeding intermittently since this afternoon. Patient with history of same and had cauterization done. Denies lightheadedness or dizziness.

## 2015-02-19 ENCOUNTER — Emergency Department (HOSPITAL_BASED_OUTPATIENT_CLINIC_OR_DEPARTMENT_OTHER)
Admission: EM | Admit: 2015-02-19 | Discharge: 2015-02-20 | Disposition: A | Discharge status: Home / Self Care | Payer: 59 | Attending: Emergency Medicine | Admitting: Emergency Medicine

## 2015-02-19 ENCOUNTER — Encounter (HOSPITAL_BASED_OUTPATIENT_CLINIC_OR_DEPARTMENT_OTHER): Payer: Self-pay | Admitting: *Deleted

## 2015-02-19 DIAGNOSIS — Z8739 Personal history of other diseases of the musculoskeletal system and connective tissue: Secondary | ICD-10-CM | POA: Diagnosis not present

## 2015-02-19 DIAGNOSIS — H9201 Otalgia, right ear: Secondary | ICD-10-CM

## 2015-02-19 DIAGNOSIS — I10 Essential (primary) hypertension: Secondary | ICD-10-CM | POA: Insufficient documentation

## 2015-02-19 DIAGNOSIS — Z8619 Personal history of other infectious and parasitic diseases: Secondary | ICD-10-CM | POA: Insufficient documentation

## 2015-02-19 DIAGNOSIS — Z7951 Long term (current) use of inhaled steroids: Secondary | ICD-10-CM | POA: Diagnosis not present

## 2015-02-19 DIAGNOSIS — Z79899 Other long term (current) drug therapy: Secondary | ICD-10-CM | POA: Insufficient documentation

## 2015-02-19 DIAGNOSIS — Z91040 Latex allergy status: Secondary | ICD-10-CM | POA: Insufficient documentation

## 2015-02-19 NOTE — ED Provider Notes (Signed)
CSN: [id number]     Arrival date & time 02/19/15  2020 History   First MD Initiated Contact with Patient 02/19/15 2330     Chief Complaint  Patient presents with  . Otalgia    HPI   45 year old female presents to the emergency room with complaints of right ear pain. Patient reports that 6 days ago she was seen by Dr. Richardson Landry of ENT. She had a polyp removed in the office out of the right nasal passage. She reports shortly after the procedure she developed right ear pain that has continued to persist since procedure. She describes this pain is sharp and stabbing but comes in waves. She denies any bloody nose, purulent drainage from the nose, drainage from the ear, difficulty hearing, nasal congestion, fever, chills, headache, neck stiffness, sore throat or intraoral swelling. Patient reports that she's been trying Tylenol and ibuprofen at home that has not provided significant pain relief. She notes that she attempt to contact Dr. Christell Constant and was unable to reach his office today.  Past Medical History  Diagnosis Date  . SVT (supraventricular tachycardia) (HCC)     takes Lopressor daily  . Hypertension   . PONV (postoperative nausea and vomiting)   . Family history of anesthesia complication     mom nausea and vomited;confusion  . Headache(784.0)     sinus  . Seasonal allergies     takes Zyrtec D as needed  . Joint swelling     right ankle  . History of shingles    Past Surgical History  Procedure Laterality Date  . Tubal ligation    . Cesarean section    . Tonsillectomy    . Uvula removed    . Nasal septum surgery    . Vaginal hysterectomy    . Ankle surgery Right   . Cholecystectomy N/A 04/13/2013    Procedure: LAPAROSCOPIC CHOLECYSTECTOMY;  Surgeon: Atilano Ina, MD;  Location: [facility];  Service: General;  Laterality: N/A;  . Cholecystectomy     No family history on file. Social History  Substance Use Topics  . Smoking status: Never Smoker   . Smokeless tobacco: None  .  Alcohol Use: No   OB History    Gravida Para Term Preterm AB TAB SAB Ectopic Multiple Living   Review of Systems  All other systems reviewed and are negative.   Allergies  Erythromycin; Latex; Sulfa antibiotics; and Adhesive  Home Medications   Prior to Admission medications   Medication Sig Start Date End Date Taking? Authorizing Provider  acetaminophen (TYLENOL) 500 MG tablet Take 500 mg by mouth every 6 (six) hours as needed.    Historical Provider, MD  amoxicillin (AMOXIL) 500 MG capsule Take 2 capsules (1,000 mg total) by mouth 2 (two) times daily. 12/17/14   Elpidio Anis, PA-C  fluticasone (FLONASE) 50 MCG/ACT nasal spray Place 1 spray into both nostrils daily.    Historical Provider, MD  hydrochlorothiazide (HYDRODIURIL) 25 MG tablet Take 25 mg by mouth daily. 11/16/14   Historical Provider, MD  metoprolol succinate (TOPROL-XL) 100 MG 24 hr tablet Take 100 mg by mouth every morning. 09/13/14   Historical Provider, MD   BP 169/108 mmHg  Pulse 92  Temp(Src) 98.1 F (36.7 C) (Oral)  Resp 20  Ht  (1.626 m)  Wt 102.059 kg  BMI 38.60 kg/m2  SpO2 100%   Physical Exam  Constitutional: She is  oriented to person, place, and time. She appears well-developed and well-nourished.  HENT:  Head: Normocephalic and atraumatic.  Right Ear: Hearing, tympanic membrane, external ear and ear canal normal.  Left Ear: Hearing, tympanic membrane, external ear and ear canal normal.  Nose: Nose normal. No mucosal edema, rhinorrhea, nose lacerations, sinus tenderness, nasal deformity, septal deviation or nasal septal hematoma. No epistaxis.  No foreign bodies. Right sinus exhibits no maxillary sinus tenderness and no frontal sinus tenderness. Left sinus exhibits no maxillary sinus tenderness and no frontal sinus tenderness.  Mouth/Throat: Uvula is midline, oropharynx is clear and moist and mucous membranes are normal.  Eyes: Conjunctivae are normal. Pupils are equal, round,  and reactive to light. Right eye exhibits no discharge. Left eye exhibits no discharge. No scleral icterus.  Neck: Normal range of motion. Neck supple. No JVD present. No tracheal deviation present.  Pulmonary/Chest: Effort normal. No stridor.  Neurological: She is alert and oriented to person, place, and time. Coordination normal.  Psychiatric: She has a normal mood and affect. Her behavior is normal. Judgment and thought content normal.  Nursing note and vitals reviewed.   ED Course  Procedures (including critical care time) Labs Review Labs Reviewed - No data to display  Imaging Review No results found. I have personally reviewed and evaluated these images and lab results as part of my medical decision-making.   EKG Interpretation None      MDM   Final diagnoses:  Otalgia, right    Labs:   Imaging:  Consults:  Therapeutics:  Discharge Meds:   Assessment/Plan: 45 year old female presents today with otalgia. Patient's symptoms likely results from recent polypectomy. She has no signs of infectious etiology, no abnormalities noted on exam. Patient was instructed to follow-up with ENT tomorrow for reevaluation and further management. She is instructed to use Tylenol or ibuprofen, return if any new or worsening signs or symptoms present. Patient verbalized understanding and agreement for today's plan and had no further questions or concerns at time of discharge         Eyvonne Mechanic, PA-C 02/19/15 2359  Doug Sou, MD 02/20/15 [id number]

## 2015-02-19 NOTE — ED Notes (Signed)
Pain in her right ear and throat after having a polyp in her nose ruptured 5 days ago.

## 2017-12-12 ENCOUNTER — Other Ambulatory Visit: Payer: Self-pay

## 2017-12-12 ENCOUNTER — Encounter (HOSPITAL_BASED_OUTPATIENT_CLINIC_OR_DEPARTMENT_OTHER): Payer: Self-pay | Admitting: Emergency Medicine

## 2017-12-12 ENCOUNTER — Emergency Department (HOSPITAL_BASED_OUTPATIENT_CLINIC_OR_DEPARTMENT_OTHER)
Admission: EM | Admit: 2017-12-12 | Discharge: 2017-12-12 | Disposition: A | Discharge status: Home / Self Care | Payer: No Typology Code available for payment source | Attending: Emergency Medicine | Admitting: Emergency Medicine

## 2017-12-12 DIAGNOSIS — Y939 Activity, unspecified: Secondary | ICD-10-CM | POA: Insufficient documentation

## 2017-12-12 DIAGNOSIS — S61210A Laceration without foreign body of right index finger without damage to nail, initial encounter: Secondary | ICD-10-CM | POA: Insufficient documentation

## 2017-12-12 DIAGNOSIS — I10 Essential (primary) hypertension: Secondary | ICD-10-CM | POA: Insufficient documentation

## 2017-12-12 DIAGNOSIS — Z79899 Other long term (current) drug therapy: Secondary | ICD-10-CM | POA: Insufficient documentation

## 2017-12-12 DIAGNOSIS — Z91040 Latex allergy status: Secondary | ICD-10-CM | POA: Insufficient documentation

## 2017-12-12 DIAGNOSIS — Y999 Unspecified external cause status: Secondary | ICD-10-CM | POA: Insufficient documentation

## 2017-12-12 DIAGNOSIS — W25XXXA Contact with sharp glass, initial encounter: Secondary | ICD-10-CM | POA: Insufficient documentation

## 2017-12-12 DIAGNOSIS — Y929 Unspecified place or not applicable: Secondary | ICD-10-CM | POA: Insufficient documentation

## 2017-12-12 DIAGNOSIS — W208XXA Other cause of strike by thrown, projected or falling object, initial encounter: Secondary | ICD-10-CM | POA: Insufficient documentation

## 2017-12-12 MED ORDER — LIDOCAINE HCL 1 % IJ SOLN
INTRAMUSCULAR | Status: AC
  Filled 2017-12-12: qty 20

## 2017-12-12 NOTE — ED Triage Notes (Signed)
Laceration to base of r index finger from a glass dish.

## 2017-12-12 NOTE — ED Provider Notes (Signed)
[facility] EMERGENCY DEPARTMENT Provider Note   CSN: [id number] Arrival date & time: 12/12/17  1411     History   Chief Complaint Chief Complaint  Patient presents with  . Laceration    HPI Cassidy Sanchez is a 47 y.o. female with history of SVT, hypertension who presents with laceration to right hand.  Patient reports a glass jar broke and fell off the counter and as it was falling, piece of glass cut her on the palmar aspect of the MCP joint of her right index finger.  This happened around 1 PM and it has continued to ooze and bleed.  She denies any numbness or tingling.  Tetanus up-to-date.  Pressure was applied without significant relief of bleeding.  HPI  Past Medical History:  Diagnosis Date  . Family history of anesthesia complication    mom nausea and vomited;confusion  . Headache(784.0)    sinus  . History of shingles   . Hypertension   . Joint swelling    right ankle  . PONV (postoperative nausea and vomiting)   . Seasonal allergies    takes Zyrtec D as needed  . SVT (supraventricular tachycardia) (HCC)    takes Lopressor daily    Patient Active Problem List   Diagnosis Date Noted  . HYPERLIPIDEMIA 10/04/2009  . 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 GLUCOSE INTOLERANCE 10/03/2009  . HYPOKALEMIA 10/03/2009  . ANEMIA 10/03/2009  . HYPERTENSION 10/03/2009  . UTI 10/03/2009  . DIZZINESS 10/03/2009  . SLEEP APNEA 10/03/2009  . SUPRAVENTRICULAR TACHYCARDIA, HX OF 10/03/2009    Past Surgical History:  Procedure Laterality Date  . ANKLE SURGERY Right   . CESAREAN SECTION    . CHOLECYSTECTOMY N/A 04/13/2013   Procedure: LAPAROSCOPIC CHOLECYSTECTOMY;  Surgeon: Atilano Ina, MD;  Location: [facility] OR;  Service: General;  Laterality: N/A;  . CHOLECYSTECTOMY    . NASAL SEPTUM SURGERY    . TONSILLECTOMY    . TUBAL LIGATION    . uvula removed    . VAGINAL HYSTERECTOMY       OB History    Gravida  2   Para  2   Term      Preterm      AB      Living   2     SAB      TAB      Ectopic      Multiple      Live Births               Home Medications    Prior to Admission medications   Medication Sig Start Date End Date Taking? Authorizing Provider  hydrochlorothiazide (HYDRODIURIL) 25 MG tablet Take 25 mg by mouth daily. 11/16/14  Yes [provider]  metoprolol succinate (TOPROL-XL) 100 MG 24 hr tablet Take 100 mg by mouth every morning. 09/13/14  Yes [provider]  acetaminophen (TYLENOL) 500 MG tablet Take 500 mg by mouth every 6 (six) hours as needed.    [provider]  amoxicillin (AMOXIL) 500 MG capsule Take 2 capsules (1,000 mg total) by mouth 2 (two) times daily. 12/17/14   Elpidio Anis, PA-C  fluticasone (FLONASE) 50 MCG/ACT nasal spray Place 1 spray into both nostrils daily.    [provider]    Family History No family history on file.  Social History Social History   Tobacco Use  . Smoking status: Never Smoker  . Smokeless tobacco: Never Used  Substance Use Topics  . Alcohol  use: No  . Drug use: No     Allergies   Erythromycin; Latex; Sulfa antibiotics; and Adhesive [tape]   Review of Systems Review of Systems  Skin: Positive for wound.  Neurological: Negative for numbness.     Physical Exam Updated Vital Signs BP (!) 167/111   Pulse 92   Temp 98 F (36.7 C) (Oral)   Resp 15   Ht 5\' 4"  (1.626 m)   Wt 97.5 kg   SpO2 99%   BMI 36.90 kg/m   Physical Exam  Constitutional: She appears well-developed and well-nourished. No distress.  HENT:  Head: Normocephalic and atraumatic.  Mouth/Throat: Oropharynx is clear and moist. No oropharyngeal exudate.  Eyes: Pupils are equal, round, and reactive to light. Conjunctivae are normal. Right eye exhibits no discharge. Left eye exhibits no discharge. No scleral icterus.  Neck: Normal range of motion. Neck supple. No thyromegaly present.  Cardiovascular: Normal rate, regular rhythm, normal heart sounds and  intact distal pulses. Exam reveals no gallop and no friction rub.  No murmur heard. Pulmonary/Chest: Effort normal and breath sounds normal. No stridor. No respiratory distress. She has no wheezes. She has no rales.  Abdominal: Soft. Bowel sounds are normal. She exhibits no distension. There is no tenderness. There is no rebound and no guarding.  Musculoskeletal: She exhibits no edema.  1 cm laceration to the palmar aspect of the 2nd MCP; full range of motion of the digit, sensation intact, cap refill less than 2 seconds  Lymphadenopathy:    She has no cervical adenopathy.  Neurological: She is alert. Coordination normal.  Skin: Skin is warm and dry. No rash noted. She is not diaphoretic. No pallor.  Psychiatric: She has a normal mood and affect.  Nursing note and vitals reviewed.    ED Treatments / Results  Labs (all labs ordered are listed, but only abnormal results are displayed) Labs Reviewed - No data to display  EKG None  Radiology No results found.  Procedures .Marland Kitchen.Laceration Repair Date/Time: 12/12/2017 6:31 PM Performed by: Emi HolesLaw, Joclynn Lumb M, PA-C Authorized by: Emi HolesLaw, Aubreyanna Dorrough M, PA-C   Consent:    Consent obtained:  Verbal   Consent given by:  Patient   Risks discussed:  Infection, pain and poor cosmetic result   Alternatives discussed:  No treatment Anesthesia (see MAR for exact dosages):    Anesthesia method:  Local infiltration   Local anesthetic:  Lidocaine 1% w/o epi Laceration details:    Location:  Hand   Hand location:  R palm   Length (cm):  1 Repair type:    Repair type:  Simple Pre-procedure details:    Preparation:  Patient was prepped and draped in usual sterile fashion Exploration:    Hemostasis achieved with:  Direct pressure   Wound exploration: wound explored through full range of motion and entire depth of wound probed and visualized     Wound extent: no foreign bodies/material noted, no muscle damage noted and no tendon damage noted      Contaminated: no   Treatment:    Area cleansed with:  Saline   Amount of cleaning:  Standard   Irrigation solution:  Sterile saline   Irrigation volume:  100   Irrigation method:  Syringe   Visualized foreign bodies/material removed: no   Skin repair:    Repair method:  Sutures   Suture size:  5-0   Wound skin closure material used: Ethilon.   Suture technique:  Simple interrupted   Number of sutures:  2 Approximation:    Approximation:  Close Post-procedure details:    Dressing:  Antibiotic ointment, non-adherent dressing and splint for protection   Patient tolerance of procedure:  Tolerated well, no immediate complications   (including critical care time)  Medications Ordered in ED Medications  lidocaine (XYLOCAINE) 1 % (with pres) injection (has no administration in time range)     Initial Impression / Assessment and Plan / ED Course  I have reviewed the triage vital signs and the nursing notes.  Pertinent labs & imaging results that were available during my care of the patient were reviewed by me and considered in my medical decision making (see chart for details).     Tetanus UTD. Laceration occurred < 12 hours prior to repair.  Laceration repaired as above.  Wrapped and given splint.  Discussed laceration care with pt and answered questions. Pt to f-u for suture removal in 10 days and wound check sooner should there be signs of dehiscence or infection.  Patient understands and agrees with plan.  Patient's blood pressure elevated and advised to get rechecked.  Return precautions discussed.  Patient vitals stable throughout ED course and discharged in satisfactory condition.   Final Clinical Impressions(s) / ED Diagnoses   Final diagnoses:  Laceration of right index finger without foreign body without damage to nail, initial encounter    ED Discharge Orders    None       Emi Holes, PA-C 12/12/17 1832    Raeford Razor, MD 12/16/17 [phone]

## 2017-12-12 NOTE — Discharge Instructions (Addendum)
Treatment: Keep your wound dry and dressing applied until this time tomorrow. After 24 hours, you may wash with warm soapy water. Dry and apply antibiotic ointment and clean dressing. Do this daily until your sutures are removed.  Wear finger splint for the first 3 to 4 days.  Follow-up: Please follow-up with your primary care provider or return to emergency department in 10 days for suture removal. Be aware of signs of infection: fever, increasing pain, redness, swelling, drainage from the area. Please call your primary care provider or return to emergency department if you develop any of these symptoms or if any of the sutures come out prior to removal. Please return to the emergency department if you develop any other new or worsening symptoms.

## 2020-02-23 ENCOUNTER — Other Ambulatory Visit: Payer: Self-pay

## 2020-02-23 ENCOUNTER — Encounter (HOSPITAL_COMMUNITY)
Admission: EM | Disposition: A | Discharge status: Home / Self Care | Payer: Self-pay | Source: Home / Self Care | Attending: Cardiovascular Disease

## 2020-02-23 ENCOUNTER — Encounter (HOSPITAL_BASED_OUTPATIENT_CLINIC_OR_DEPARTMENT_OTHER): Payer: Self-pay

## 2020-02-23 ENCOUNTER — Inpatient Hospital Stay (HOSPITAL_BASED_OUTPATIENT_CLINIC_OR_DEPARTMENT_OTHER)
Admission: EM | Admit: 2020-02-23 | Discharge: 2020-02-24 | DRG: 281 | Disposition: A | Discharge status: Home / Self Care | Payer: 59 | Attending: Cardiovascular Disease | Admitting: Cardiovascular Disease

## 2020-02-23 ENCOUNTER — Emergency Department (HOSPITAL_BASED_OUTPATIENT_CLINIC_OR_DEPARTMENT_OTHER): Payer: 59

## 2020-02-23 ENCOUNTER — Observation Stay (HOSPITAL_COMMUNITY): Payer: 59

## 2020-02-23 DIAGNOSIS — E785 Hyperlipidemia, unspecified: Secondary | ICD-10-CM | POA: Diagnosis present

## 2020-02-23 DIAGNOSIS — E669 Obesity, unspecified: Secondary | ICD-10-CM | POA: Diagnosis present

## 2020-02-23 DIAGNOSIS — Z91040 Latex allergy status: Secondary | ICD-10-CM

## 2020-02-23 DIAGNOSIS — R079 Chest pain, unspecified: Secondary | ICD-10-CM

## 2020-02-23 DIAGNOSIS — Z881 Allergy status to other antibiotic agents status: Secondary | ICD-10-CM | POA: Diagnosis not present

## 2020-02-23 DIAGNOSIS — E782 Mixed hyperlipidemia: Secondary | ICD-10-CM | POA: Diagnosis not present

## 2020-02-23 DIAGNOSIS — I10 Essential (primary) hypertension: Secondary | ICD-10-CM | POA: Diagnosis present

## 2020-02-23 DIAGNOSIS — I471 Supraventricular tachycardia: Secondary | ICD-10-CM | POA: Diagnosis present

## 2020-02-23 DIAGNOSIS — Z801 Family history of malignant neoplasm of trachea, bronchus and lung: Secondary | ICD-10-CM | POA: Diagnosis not present

## 2020-02-23 DIAGNOSIS — Z20822 Contact with and (suspected) exposure to covid-19: Secondary | ICD-10-CM | POA: Diagnosis present

## 2020-02-23 DIAGNOSIS — Z888 Allergy status to other drugs, medicaments and biological substances status: Secondary | ICD-10-CM

## 2020-02-23 DIAGNOSIS — D72829 Elevated white blood cell count, unspecified: Secondary | ICD-10-CM | POA: Diagnosis not present

## 2020-02-23 DIAGNOSIS — Z8249 Family history of ischemic heart disease and other diseases of the circulatory system: Secondary | ICD-10-CM

## 2020-02-23 DIAGNOSIS — I2510 Atherosclerotic heart disease of native coronary artery without angina pectoris: Secondary | ICD-10-CM | POA: Diagnosis present

## 2020-02-23 DIAGNOSIS — Z6841 Body Mass Index (BMI) 40.0 and over, adult: Secondary | ICD-10-CM | POA: Diagnosis not present

## 2020-02-23 DIAGNOSIS — Z9049 Acquired absence of other specified parts of digestive tract: Secondary | ICD-10-CM

## 2020-02-23 DIAGNOSIS — Z9889 Other specified postprocedural states: Secondary | ICD-10-CM

## 2020-02-23 DIAGNOSIS — I214 Non-ST elevation (NSTEMI) myocardial infarction: Principal | ICD-10-CM | POA: Diagnosis present

## 2020-02-23 DIAGNOSIS — Z79899 Other long term (current) drug therapy: Secondary | ICD-10-CM | POA: Diagnosis not present

## 2020-02-23 DIAGNOSIS — Z825 Family history of asthma and other chronic lower respiratory diseases: Secondary | ICD-10-CM

## 2020-02-23 DIAGNOSIS — I249 Acute ischemic heart disease, unspecified: Secondary | ICD-10-CM | POA: Diagnosis not present

## 2020-02-23 DIAGNOSIS — Z882 Allergy status to sulfonamides status: Secondary | ICD-10-CM

## 2020-02-23 HISTORY — DX: Hyperlipidemia, unspecified: E78.5

## 2020-02-23 HISTORY — PX: LEFT HEART CATH AND CORONARY ANGIOGRAPHY: CATH118249

## 2020-02-23 LAB — CBC
HCT: 38.5 % (ref 36.0–46.0)
Hemoglobin: 13.2 g/dL (ref 12.0–15.0)
MCH: 30.1 pg (ref 26.0–34.0)
MCHC: 34.3 g/dL (ref 30.0–36.0)
MCV: 87.7 fL (ref 80.0–100.0)
Platelets: 370 10*3/uL (ref 150–400)
RBC: 4.39 MIL/uL (ref 3.87–5.11)
RDW: 11.9 % (ref 11.5–15.5)
WBC: 16.5 10*3/uL — ABNORMAL HIGH (ref 4.0–10.5)
nRBC: 0 % (ref 0.0–0.2)

## 2020-02-23 LAB — RESP PANEL BY RT-PCR (FLU A&B, COVID) ARPGX2
Influenza A by PCR: NEGATIVE
Influenza B by PCR: NEGATIVE
SARS Coronavirus 2 by RT PCR: NEGATIVE

## 2020-02-23 LAB — ECHOCARDIOGRAM COMPLETE
Height: 64 in
Weight: 3744 oz

## 2020-02-23 LAB — TROPONIN I (HIGH SENSITIVITY)
Troponin I (High Sensitivity): 119 ng/L (ref <18)
Troponin I (High Sensitivity): 1617 ng/L (ref <18)
Troponin I (High Sensitivity): 2377 ng/L (ref <18)

## 2020-02-23 LAB — POCT ACTIVATED CLOTTING TIME
Activated Clotting Time: 166 seconds
Activated Clotting Time: 190 seconds

## 2020-02-23 LAB — BASIC METABOLIC PANEL
Anion gap: 10 (ref 5–15)
BUN: 10 mg/dL (ref 6–20)
CO2: 25 mmol/L (ref 22–32)
Calcium: 9.1 mg/dL (ref 8.9–10.3)
Chloride: 100 mmol/L (ref 98–111)
Creatinine, Ser: 0.7 mg/dL (ref 0.44–1.00)
GFR, Estimated: >60 mL/min (ref >60)
Glucose, Bld: 213 mg/dL — ABNORMAL HIGH (ref 70–99)
Potassium: 3.1 mmol/L — ABNORMAL LOW (ref 3.5–5.1)
Sodium: 135 mmol/L (ref 135–145)

## 2020-02-23 LAB — PREGNANCY, URINE: Preg Test, Ur: NEGATIVE

## 2020-02-23 SURGERY — LEFT HEART CATH AND CORONARY ANGIOGRAPHY
Anesthesia: LOCAL

## 2020-02-23 MED ORDER — HEPARIN BOLUS VIA INFUSION
4000.0000 [IU] | Freq: Once | INTRAVENOUS | Status: AC
  Administered 2020-02-23: 4000 [IU] via INTRAVENOUS

## 2020-02-23 MED ORDER — HEPARIN SODIUM (PORCINE) 1000 UNIT/ML IJ SOLN
INTRAMUSCULAR | Status: DC | PRN
  Administered 2020-02-23: 5000 [IU] via INTRAVENOUS

## 2020-02-23 MED ORDER — ATORVASTATIN CALCIUM 80 MG PO TABS
80.0000 mg | ORAL_TABLET | Freq: Every day | ORAL | Status: DC
  Administered 2020-02-23 – 2020-02-24 (×2): 80 mg via ORAL
  Filled 2020-02-23 (×2): qty 1

## 2020-02-23 MED ORDER — SODIUM CHLORIDE 0.9% FLUSH: 3.0000 mL | Freq: Two times a day (BID) | INTRAVENOUS | Status: DC

## 2020-02-23 MED ORDER — CLOPIDOGREL BISULFATE 300 MG PO TABS
ORAL_TABLET | ORAL | Status: AC
  Filled 2020-02-23: qty 1

## 2020-02-23 MED ORDER — ATORVASTATIN CALCIUM 80 MG PO TABS
80.0000 mg | ORAL_TABLET | Freq: Every day | ORAL | Status: DC
  Administered 2020-02-23: 80 mg via ORAL
  Filled 2020-02-23: qty 1

## 2020-02-23 MED ORDER — ONDANSETRON HCL 4 MG/2ML IJ SOLN: 4.0000 mg | Freq: Four times a day (QID) | INTRAMUSCULAR | Status: DC | PRN

## 2020-02-23 MED ORDER — SODIUM CHLORIDE 0.9 % IV BOLUS
1000.0000 mL | Freq: Once | INTRAVENOUS | Status: AC
  Administered 2020-02-23: 1000 mL via INTRAVENOUS

## 2020-02-23 MED ORDER — MIDAZOLAM HCL 2 MG/2ML IJ SOLN
INTRAMUSCULAR | Status: AC
  Filled 2020-02-23: qty 2

## 2020-02-23 MED ORDER — MIDAZOLAM HCL 2 MG/2ML IJ SOLN
INTRAMUSCULAR | Status: DC | PRN
  Administered 2020-02-23: 1 mg via INTRAVENOUS
  Administered 2020-02-23: 2 mg via INTRAVENOUS
  Administered 2020-02-23: 1 mg via INTRAVENOUS
  Administered 2020-02-23: 2 mg via INTRAVENOUS

## 2020-02-23 MED ORDER — NITROGLYCERIN 2 % TD OINT
1.0000 [in_us] | TOPICAL_OINTMENT | Freq: Once | TRANSDERMAL | Status: AC
  Administered 2020-02-23: 1 [in_us] via TOPICAL
  Filled 2020-02-23: qty 1

## 2020-02-23 MED ORDER — LIDOCAINE HCL (PF) 1 % IJ SOLN
INTRAMUSCULAR | Status: DC | PRN
  Administered 2020-02-23: 2 mL
  Administered 2020-02-23: 15 mL

## 2020-02-23 MED ORDER — SODIUM CHLORIDE 0.9% FLUSH: 3.0000 mL | INTRAVENOUS | Status: DC | PRN

## 2020-02-23 MED ORDER — ASPIRIN EC 81 MG PO TBEC: 81.0000 mg | DELAYED_RELEASE_TABLET | Freq: Every day | ORAL | Status: DC

## 2020-02-23 MED ORDER — FENTANYL CITRATE (PF) 100 MCG/2ML IJ SOLN
INTRAMUSCULAR | Status: AC
  Filled 2020-02-23: qty 2

## 2020-02-23 MED ORDER — ACETAMINOPHEN 325 MG PO TABS
650.0000 mg | ORAL_TABLET | ORAL | Status: DC | PRN
  Administered 2020-02-23 – 2020-02-24 (×3): 650 mg via ORAL
  Filled 2020-02-23: qty 2

## 2020-02-23 MED ORDER — VERAPAMIL HCL 2.5 MG/ML IV SOLN
INTRAVENOUS | Status: AC
  Filled 2020-02-23: qty 2

## 2020-02-23 MED ORDER — HEPARIN SODIUM (PORCINE) 1000 UNIT/ML IJ SOLN
INTRAMUSCULAR | Status: AC
  Filled 2020-02-23: qty 1

## 2020-02-23 MED ORDER — HYDRALAZINE HCL 20 MG/ML IJ SOLN: 10.0000 mg | INTRAMUSCULAR | Status: AC | PRN

## 2020-02-23 MED ORDER — NITROGLYCERIN 1 MG/10 ML FOR IR/CATH LAB
INTRA_ARTERIAL | Status: DC | PRN
  Administered 2020-02-23 (×2): 200 ug via INTRA_ARTERIAL

## 2020-02-23 MED ORDER — CLOPIDOGREL BISULFATE 75 MG PO TABS
75.0000 mg | ORAL_TABLET | Freq: Every day | ORAL | Status: DC
  Administered 2020-02-24: 75 mg via ORAL
  Filled 2020-02-23: qty 1

## 2020-02-23 MED ORDER — SODIUM CHLORIDE 0.9 % IV SOLN
250.0000 mL | INTRAVENOUS | Status: DC | PRN
Start: 2020-02-23 — End: 2020-02-24

## 2020-02-23 MED ORDER — VERAPAMIL HCL ER 120 MG PO TBCR
120.0000 mg | EXTENDED_RELEASE_TABLET | Freq: Every day | ORAL | Status: DC
  Administered 2020-02-23 – 2020-02-24 (×2): 120 mg via ORAL
  Filled 2020-02-23 (×2): qty 1

## 2020-02-23 MED ORDER — AMITRIPTYLINE HCL 10 MG PO TABS
10.0000 mg | ORAL_TABLET | Freq: Every day | ORAL | Status: DC
  Administered 2020-02-23: 10 mg via ORAL
  Filled 2020-02-23 (×2): qty 1

## 2020-02-23 MED ORDER — DIAZEPAM 5 MG PO TABS: 5.0000 mg | ORAL_TABLET | ORAL | Status: DC | PRN

## 2020-02-23 MED ORDER — SODIUM CHLORIDE 0.9 % WEIGHT BASED INFUSION
1.0000 mL/kg/h | INTRAVENOUS | Status: DC
  Administered 2020-02-23: 1 mL/kg/h via INTRAVENOUS

## 2020-02-23 MED ORDER — SODIUM CHLORIDE 0.9 % IV SOLN: INTRAVENOUS | Status: DC

## 2020-02-23 MED ORDER — LABETALOL HCL 5 MG/ML IV SOLN: 10.0000 mg | INTRAVENOUS | Status: AC | PRN

## 2020-02-23 MED ORDER — ASPIRIN 81 MG PO CHEW
324.0000 mg | CHEWABLE_TABLET | Freq: Once | ORAL | Status: AC
  Administered 2020-02-23: 324 mg via ORAL
  Filled 2020-02-23: qty 4

## 2020-02-23 MED ORDER — VERAPAMIL HCL 2.5 MG/ML IV SOLN
INTRAVENOUS | Status: DC | PRN
  Administered 2020-02-23: 2 mg via INTRA_ARTERIAL

## 2020-02-23 MED ORDER — POTASSIUM CHLORIDE CRYS ER 20 MEQ PO TBCR
EXTENDED_RELEASE_TABLET | ORAL | Status: AC
  Filled 2020-02-23: qty 3

## 2020-02-23 MED ORDER — KETOROLAC TROMETHAMINE 30 MG/ML IJ SOLN
30.0000 mg | Freq: Once | INTRAMUSCULAR | Status: AC
  Administered 2020-02-23: 30 mg via INTRAVENOUS
  Filled 2020-02-23: qty 1

## 2020-02-23 MED ORDER — NITROGLYCERIN 0.4 MG SL SUBL: 0.4000 mg | SUBLINGUAL_TABLET | SUBLINGUAL | Status: DC | PRN

## 2020-02-23 MED ORDER — ACETAMINOPHEN 325 MG PO TABS
650.0000 mg | ORAL_TABLET | ORAL | Status: DC | PRN
  Filled 2020-02-23: qty 2

## 2020-02-23 MED ORDER — IOHEXOL 350 MG/ML SOLN
INTRAVENOUS | Status: DC | PRN
  Administered 2020-02-23: 75 mL via INTRA_ARTERIAL

## 2020-02-23 MED ORDER — VERAPAMIL HCL 2.5 MG/ML IV SOLN
INTRAVENOUS | Status: DC | PRN
  Administered 2020-02-23 (×2): 10 mL via INTRA_ARTERIAL

## 2020-02-23 MED ORDER — HEPARIN (PORCINE) IN NACL 1000-0.9 UT/500ML-% IV SOLN
INTRAVENOUS | Status: DC | PRN
  Administered 2020-02-23 (×3): 500 mL

## 2020-02-23 MED ORDER — BUPROPION HCL ER (XL) 150 MG PO TB24
150.0000 mg | ORAL_TABLET | Freq: Every day | ORAL | Status: DC
  Administered 2020-02-23 – 2020-02-24 (×2): 150 mg via ORAL
  Filled 2020-02-23 (×2): qty 1

## 2020-02-23 MED ORDER — LIDOCAINE HCL (PF) 1 % IJ SOLN
INTRAMUSCULAR | Status: AC
  Filled 2020-02-23: qty 30

## 2020-02-23 MED ORDER — HEPARIN (PORCINE) 25000 UT/250ML-% IV SOLN
1100.0000 [IU]/h | INTRAVENOUS | Status: DC
  Administered 2020-02-23: 1100 [IU]/h via INTRAVENOUS
  Filled 2020-02-23: qty 250

## 2020-02-23 MED ORDER — FENTANYL CITRATE (PF) 100 MCG/2ML IJ SOLN
INTRAMUSCULAR | Status: DC | PRN
  Administered 2020-02-23 (×4): 25 ug via INTRAVENOUS

## 2020-02-23 MED ORDER — NITROGLYCERIN 1 MG/10 ML FOR IR/CATH LAB
INTRA_ARTERIAL | Status: AC
  Filled 2020-02-23: qty 10

## 2020-02-23 MED ORDER — SODIUM CHLORIDE 0.9 % WEIGHT BASED INFUSION: 3.0000 mL/kg/h | INTRAVENOUS | Status: AC

## 2020-02-23 MED ORDER — ALUM & MAG HYDROXIDE-SIMETH 200-200-20 MG/5ML PO SUSP
30.0000 mL | Freq: Four times a day (QID) | ORAL | Status: DC | PRN
  Administered 2020-02-23: 30 mL via ORAL
  Filled 2020-02-23: qty 30

## 2020-02-23 MED ORDER — CLOPIDOGREL BISULFATE 75 MG PO TABS
300.0000 mg | ORAL_TABLET | Freq: Once | ORAL | Status: AC
  Administered 2020-02-23: 300 mg via ORAL

## 2020-02-23 MED ORDER — ACETAMINOPHEN 325 MG PO TABS
ORAL_TABLET | ORAL | Status: AC
  Filled 2020-02-23: qty 2

## 2020-02-23 MED ORDER — ASPIRIN 81 MG PO CHEW
81.0000 mg | CHEWABLE_TABLET | Freq: Every day | ORAL | Status: DC
  Administered 2020-02-23 – 2020-02-24 (×2): 81 mg via ORAL
  Filled 2020-02-23: qty 1

## 2020-02-23 MED ORDER — SODIUM CHLORIDE 0.9 % IV SOLN: 250.0000 mL | INTRAVENOUS | Status: DC | PRN

## 2020-02-23 MED ORDER — ASPIRIN 81 MG PO CHEW
CHEWABLE_TABLET | ORAL | Status: AC
  Filled 2020-02-23: qty 1

## 2020-02-23 MED ORDER — ONDANSETRON HCL 4 MG/2ML IJ SOLN
4.0000 mg | Freq: Once | INTRAMUSCULAR | Status: AC
  Administered 2020-02-23: 4 mg via INTRAVENOUS
  Filled 2020-02-23: qty 2

## 2020-02-23 MED ORDER — POTASSIUM CHLORIDE CRYS ER 20 MEQ PO TBCR
60.0000 meq | EXTENDED_RELEASE_TABLET | Freq: Once | ORAL | Status: AC
  Administered 2020-02-23: 60 meq via ORAL

## 2020-02-23 MED ORDER — HEPARIN (PORCINE) IN NACL 1000-0.9 UT/500ML-% IV SOLN
INTRAVENOUS | Status: AC
  Filled 2020-02-23: qty 1000

## 2020-02-23 SURGICAL SUPPLY — 17 items
BAG SNAP BAND KOVER 36X36 (MISCELLANEOUS) ×2 IMPLANT
CATH INFINITI JR4 5F (CATHETERS) ×2 IMPLANT
CATH INFINITI MULTIPACK ANG 4F (CATHETERS) ×4 IMPLANT
CATH OPTITORQUE TIG 4.0 5F (CATHETERS) ×2 IMPLANT
COVER DOME SNAP 22 D (MISCELLANEOUS) ×2 IMPLANT
DEVICE RAD COMP TR BAND LRG (VASCULAR PRODUCTS) ×2 IMPLANT
GLIDESHEATH SLEND SS 6F .021 (SHEATH) ×2 IMPLANT
GUIDEWIRE INQWIRE 1.5J.035X260 (WIRE) ×1 IMPLANT
INQWIRE 1.5J .035X260CM (WIRE) ×2
KIT HEART LEFT (KITS) ×2 IMPLANT
KIT MICROPUNCTURE NIT STIFF (SHEATH) ×2 IMPLANT
PACK CARDIAC CATHETERIZATION (CUSTOM PROCEDURE TRAY) ×2 IMPLANT
SHEATH PINNACLE 5F 10CM (SHEATH) ×2 IMPLANT
SHEATH PROBE COVER 6X72 (BAG) ×2 IMPLANT
TRANSDUCER W/STOPCOCK (MISCELLANEOUS) ×2 IMPLANT
TUBING CIL FLEX 10 FLL-RA (TUBING) ×2 IMPLANT
WIRE EMERALD 3MM-J .035X150CM (WIRE) ×2 IMPLANT

## 2020-02-23 NOTE — ED Notes (Signed)
Care Handoff/Report given to [place], Consulting civil engineer at [facility] ED. All questions answered. Patient made aware of transfer process and signed transfer consent willingly.

## 2020-02-23 NOTE — Progress Notes (Addendum)
Pt arrived to [facility] with IV heparin infusing at 1100 units/hour into LFA PIV, safety maintained

## 2020-02-23 NOTE — H&P (Addendum)
Cardiology Admission History and Physical:   Patient ID: SHONTE BEUTLER MRN: [id number]; DOB: [birth year]   Admission date: 02/23/2020  PCP:  Gillian Scarce   [facility]  Cardiologist:  New to [facility]; Dr. Tresa Endo Advanced Practice Provider:  No care team member to display Electrophysiologist:  None  Chief Complaint:  Chest pain  Patient Profile:   Cassidy Sanchez is a 50 y.o. female with a PMH of HTN, HLD (just started on simvastatin last week), and SVT, who presents with chest pain.  History of Present Illness:   Cassidy Sanchez was in her usual state of health until the evening of 02/22/20 when she began experiencing chest pain. She got home in the evening after picking up [employer] for dinner. She ate, then took a shower. Upon exiting the shower she had sudden onset nausea, lightheadedness, chest tightness, and several episodes of NBNB emesis. She noticed associated SOB and diaphoresis. Symptoms continued for a couple hours until she received nitroglycerin after presenting to [facility] for further evaluation.   She has no prior heart disease history. Appears she was seen remotely by cardiology in 2011 for near syncope and SVT. Echo at that time was unremarkable. It does not appear she has had a prior ischemic evaluation. She denies family history of CAD - mother has HTN, father is deceased from lung cancer in the 64s. She denies tobacco, ETOH, or illicit drug use. She was recently seen by her PCP and started on simvastatin for HLD (LDL 159 02/15/20). She takes lisinopril 20mg  daily for HTN. She notes occasional orthostatic dizziness, though no syncopal episodes. She denies prior issues with chest pain. She works from home in for [employer]. She helps with caring for her 18 year old granddaughter which keeps her busy. She has no anginal complaints, orthopnea, PND, LE edema. She denies recent infectious symptoms - fever, URI symptoms, dysuria, abdominal  pain, or diarrhea.   ED course: Hypertensive, intermittently tachypneic/tachycardic, otherwise VSS. Labs notable for K 3.1, Cr 0.7, WBC 16.5, Hgb 13.2, PLT 370, HsTrop 119>1617>2377, influenza/COVID-19 negative.  EKG with sinus rhythm, rate 82 bpm, no STE/D, no TWI, QTc 495. Repeat EKG with sinus rhythm, rate 98 bpm, non-specific T wave abnormalities (predominately inferior leads), no STE/D, QTc 466. CXR without acute findings. She was given aspirin and zofran in the ED. Cardiology was notified of elevated troponins and recommended to start heparin gtt and nitro paste. Decision made to transfer directly to the cath lab for further evaluation of her NSTEMI.   Past Medical History:  Diagnosis Date  . Family history of anesthesia complication    mom nausea and vomited;confusion  . Headache(784.0)    sinus  . History of shingles   . Hyperlipidemia   . Hypertension   . Joint swelling    right ankle  . PONV (postoperative nausea and vomiting)   . Seasonal allergies    takes Zyrtec D as needed  . SVT (supraventricular tachycardia) (HCC)    takes Lopressor daily    Past Surgical History:  Procedure Laterality Date  . ANKLE SURGERY Right   . CESAREAN SECTION    . CHOLECYSTECTOMY N/A 04/13/2013   Procedure: LAPAROSCOPIC CHOLECYSTECTOMY;  Surgeon: 06/13/2013, MD;  Location: [facility] OR;  Service: General;  Laterality: N/A;  . CHOLECYSTECTOMY    . NASAL SEPTUM SURGERY    . TONSILLECTOMY    . TUBAL LIGATION    . uvula removed    . VAGINAL HYSTERECTOMY  Medications Prior to Admission: Prior to Admission medications   Medication Sig Start Date End Date Taking? Authorizing Provider  acetaminophen (TYLENOL) 500 MG tablet Take 500 mg by mouth every 6 (six) hours as needed.   Yes [provider]  fluticasone (FLONASE) 50 MCG/ACT nasal spray Place 1 spray into both nostrils daily.   Yes [provider]  hydrochlorothiazide (HYDRODIURIL) 25 MG tablet Take 25 mg by mouth daily.  11/16/14  Yes [provider]  metoprolol succinate (TOPROL-XL) 100 MG 24 hr tablet Take 100 mg by mouth every morning. 09/13/14  Yes [provider]  amoxicillin (AMOXIL) 500 MG capsule Take 2 capsules (1,000 mg total) by mouth 2 (two) times daily. 12/17/14   Elpidio AnisUpstill, Shari, PA-C     Allergies:    Allergies  Allergen Reactions  . Erythromycin Hives  . Latex Hives and Itching  . Sulfa Antibiotics Itching  . Adhesive [Tape] Rash    Paper tape ok.     Social History:   Social History   Socioeconomic History  . Marital status: Single    Spouse name: Not on file  . Number of children: Not on file  . Years of education: Not on file  . Highest education level: Not on file  Occupational History  . Not on file  Tobacco Use  . Smoking status: Never Smoker  . Smokeless tobacco: Never Used  Substance and Sexual Activity  . Alcohol use: No  . Drug use: No  . Sexual activity: Not on file  Other Topics Concern  . Not on file  Social History Narrative  . Not on file   Social Determinants of Health   Financial Resource Strain: Not on file  Food Insecurity: Not on file  Transportation Needs: Not on file  Physical Activity: Not on file  Stress: Not on file  Social Connections: Not on file  Intimate Partner Violence: Not on file    Family History:   The patient's family history includes Asthma in her mother; High blood pressure in her mother; Lung cancer in her father.    ROS:  Please see the history of present illness.  All other ROS reviewed and negative.     Physical Exam/Data:   Vitals:   02/23/20 0535 02/23/20 0645 02/23/20 0732 02/23/20 0842  BP: (!) 145/90 134/78 134/78 (!) 144/72  Pulse: 100 100 (!) 106 98  Resp: 20 19 (!) 22 18  Temp: 98.5 F (36.9 C)  98 F (36.7 C)   TempSrc: Oral  Oral   SpO2: 100% 96% 97% 99%  Weight:      Height:        Intake/Output Summary (Last 24 hours) at 02/23/2020 0910 Last data filed at 02/23/2020 0435 Gross  per 24 hour  Intake 999 ml  Output 0 ml  Net 999 ml   Last 3 Weights 02/23/2020 12/12/2017 02/19/2015  Weight (lbs) 234 lb 215 lb 225 lb  Weight (kg) 106.142 kg 97.523 kg 102.059 kg     Body mass index is 40.17 kg/m.  General:  Well nourished, well developed, in no acute distress HEENT: sclera anicteric Lymph: no adenopathy Neck: no JVD Endocrine:  No thryomegaly Vascular: No carotid bruits; distal pulses 2+ bilaterally   Cardiac:  normal S1, S2; RRR; no murmurs, rubs, or gallops Lungs:  clear to auscultation bilaterally, no wheezing, rhonchi or rales  Abd: soft, obese, nontender, no hepatomegaly  Ext: no edema Musculoskeletal:  No deformities, BUE and BLE strength normal and equal  Skin: warm and dry  Neuro:  CNs 2-12 intact, no focal abnormalities noted Psych:  Normal affect    EKG: initial EKG with sinus rhythm, rate 82 bpm, no STE/D, no TWI, QTc 495. Repeat EKG with sinus rhythm, rate 98 bpm, non-specific T wave abnormalities (predominately inferior leads), no STE/D, QTc 466.  Relevant CV Studies: Echo from 2011 reviewed with normal EF, no RWMA, and no significant valvular abnormalities  Laboratory Data:  High Sensitivity Troponin:   Recent Labs  Lab 02/23/20 0152 02/23/20 0358 02/23/20 0553  TROPONINIHS 119* 1,617* 2,377*      Chemistry Recent Labs  Lab 02/23/20 0152  NA 135  K 3.1*  CL 100  CO2 25  GLUCOSE 213*  BUN 10  CREATININE 0.70  CALCIUM 9.1  GFRNONAA >60  ANIONGAP 10    No results for input(s): PROT, ALBUMIN, AST, ALT, ALKPHOS, BILITOT in the last 168 hours. Hematology Recent Labs  Lab 02/23/20 0152  WBC 16.5*  RBC 4.39  HGB 13.2  HCT 38.5  MCV 87.7  MCH 30.1  MCHC 34.3  RDW 11.9  PLT 370   BNPNo results for input(s): BNP, PROBNP in the last 168 hours.  DDimer No results for input(s): DDIMER in the last 168 hours.   Radiology/Studies:  DG Chest 2 View  Result Date: 02/23/2020 CLINICAL DATA:  Chest pain. EXAM: CHEST - 2 VIEW  COMPARISON:  04/06/2013 FINDINGS: The cardiomediastinal contours are normal. The lungs are clear. Pulmonary vasculature is normal. No consolidation, pleural effusion, or pneumothorax. No acute osseous abnormalities are seen. IMPRESSION: No acute chest findings. Electronically Signed   By: Narda Rutherford M.D.   On: 02/23/2020 02:26     Assessment and Plan:   1. NSTEMI: patient presented following an episode of chest pain on the evening of 02/22/20. She had associated lightheadedness, diaphoresis, nausea/vomiting, and SOB. Symptoms lasted several minutes before improving, though still with some lingering symptoms prompting her to present to [facility]. Her initial EKG was non-ischemic with non-specific T wave abnormalities noted on repeat. HsTrop uptrending from [phone]. She was brought to the cath lab for definitive evaluation. - The patient understands that risks included but are not limited to stroke (1 in 1000), death (1 in 1000), kidney failure [usually temporary] (1 in 500), bleeding (1 in 200), allergic reaction [possibly serious] (1 in 200).  The patient understands and agrees to proceed.  - Will check an echocardiogram - Will check FLP and A1C for risk stratification  - Anticipate starting BBlocker following LHC  2. HTN: BP generally elevated.  - Will resume home lisinopril tomorrow if Cr stable - Anticipate starting BBlocker pending above work-up  3. HLD: LDL 159 02/15/20; recently started on simvastatin 20mg  daily. - Will start atorvastatin 80mg  daily  4. History of SVT: No recent complaints of palpitations.  - Will monitor on telemetry  5. Leukocytosis: WBC elevated to 16.5 on admission labs. Possible this is reactive in the setting of #1. No fevers. No infectious symptoms. COVID-19 and influenza negative.  - Continue to monitor WBC/fever trend     Risk Assessment/Risk Scores:     TIMI Risk Score for Unstable Angina or Non-ST Elevation MI:   The patient's TIMI risk score is  1, which indicates a 5% risk of all cause mortality, new or recurrent myocardial infarction or need for urgent revascularization in the next 14 days.       Severity of Illness: The appropriate patient status for this patient is OBSERVATION. Observation status is  judged to be reasonable and necessary in order to provide the required intensity of service to ensure the patient's safety. The patient's presenting symptoms, physical exam findings, and initial radiographic and laboratory data in the context of their medical condition is felt to place them at decreased risk for further clinical deterioration. Furthermore, it is anticipated that the patient will be medically stable for discharge from the hospital within 2 midnights of admission. The following factors support the patient status of observation.   " The patient's presenting symptoms include chest pain. " The physical exam findings include benign cardiopulmonary exam. " The initial radiographic and laboratory data are elevated HsTroponins.     For questions or updates, please contact [facility] Please consult [email or web address] for contact info under     Signed, Beatriz Stallion, PA-C  02/23/2020 9:10 AM   Patient seen and examined. Agree with assessment and plan.  Cassidy Sanchez is a very pleasant 50 year old female has a history of hypertension and hyperlipidemia been on lisinopril and recently started on simvastatin.  She denies any prior history of chest pain.  He has a remote history of SVT.  Last evening and today she began to experience some lightheadedness chest tightness after taking a shower.  She presented to med [facility] today.  High-sensitivity troponin t values are progressively increasing with most recent at 2377 system with a non-STEMI.  Is only, her chest pain has improved and she is on heparin and Nitropaste.  Telemetry reveals sinus rhythm at 98-100.  She is obese wiith a BMI of 40.17.  HEENT is  unremarkable.  There are no carotid bruits.  Lungs are clear.  Rhythm is regular without ectopy.  Abdomen soft nontender.  There is no edema.  Pulses are adequate.  She had normal affect and mood.  Neurologic exam was grossly nonfocal.  ECG shows 98 with mild T wave abnormality.  QTc 466.  Patient's chest pain has significantly improved.  In light of high suspicion for CAD, cardiac catheterization is recommended. I have reviewed the risks, indications, and alternatives to cardiac catheterization, possible angioplasty, and stenting with the patient. Risks include but are not limited to bleeding, infection, vascular injury, stroke, myocardial infection, arrhythmia, kidney injury, radiation-related injury in the case of prolonged fluoroscopy use, emergency cardiac surgery, and death. The patient understands the risks of serious complication is 1-2 in 1000 with diagnostic cardiac cath and 1-2% or less with angioplasty/stenting.  Patient has consented to have this procedure performed and plan to proceed with catheterization.   Lennette Bihari, MD, [facility] 02/23/2020 9:18 AM

## 2020-02-23 NOTE — Progress Notes (Signed)
LOCATION:  Right RADIAL  DEFLATED PER PROTOCOL: YES  TIME BAND OFF/DRESSING APPLIED: 1330  SITE UPON ARRIVAL: LEVEL 0  SITE AFTER BAND REMOVAL: LEVEL 0  CIRCULATION SENSATION AND MOVEMENT: yes, bilateral radial pulses at +2  COMMENTS:

## 2020-02-23 NOTE — ED Notes (Signed)
Care Handoff/Report given to [place], [facility]. All questions answered. Estimated ETA will be 20-35 minutes. Oncoming RN made aware.

## 2020-02-23 NOTE — Progress Notes (Signed)
  Echocardiogram 2D Echocardiogram has been performed.  Leta Jungling M 02/23/2020, 11:46 AM

## 2020-02-23 NOTE — Progress Notes (Signed)
ANTICOAGULATION CONSULT NOTE - Initial Consult  Pharmacy Consult for heparin Indication: chest pain/ACS  Allergies  Allergen Reactions  . Erythromycin Hives  . Latex Hives and Itching  . Sulfa Antibiotics Itching  . Adhesive [Tape] Rash    Paper tape ok.     Patient Measurements: Height: 5\' 4"  (162.6 cm) Weight: 106.1 kg (234 lb) IBW/kg (Calculated) : 54.7 Heparin Dosing Weight: 80kg  Vital Signs: Temp: 98.4 F (36.9 C) (02/18 0141) Temp Source: Oral (02/18 0141) BP: 141/85 (02/18 0356) Pulse Rate: 95 (02/18 0356)  Labs: Recent Labs    02/23/20 0152  HGB 13.2  HCT 38.5  PLT 370  CREATININE 0.70  TROPONINIHS 119*    Estimated Creatinine Clearance: 100 mL/min (by C-G formula based on SCr of 0.7 mg/dL).   Medical History: Past Medical History:  Diagnosis Date  . Family history of anesthesia complication    mom nausea and vomited;confusion  . Headache(784.0)    sinus  . History of shingles   . Hypertension   . Joint swelling    right ankle  . PONV (postoperative nausea and vomiting)   . Seasonal allergies    takes Zyrtec D as needed  . SVT (supraventricular tachycardia) (HCC)    takes Lopressor daily    Assessment: 50yo female c/o CP associated w/ N/V/back pain, initial troponin elevated, to begin heparin.  Goal of Therapy:  Heparin level 0.3-0.7 units/ml Monitor platelets by anticoagulation protocol: Yes   Plan:  Will give heparin 4000 units IV bolus x1 followed by gtt at 1100 units/hr and monitor heparin levels and CBC.  50yo, PharmD, BCPS  02/23/2020,4:06 AM

## 2020-02-23 NOTE — ED Notes (Signed)
Cassidy Sanchez from [facility] Cath lab called; pt to be taken directly to cath lab. Notified charge nurse at [facility]. Cassidy Sanchez to notify [facility].

## 2020-02-23 NOTE — Progress Notes (Deleted)
IV Heparin infusing at 1100 units/hour into 22g LFA PIV upon arrival to [place] [facility], CL Holding, safety maintained

## 2020-02-23 NOTE — Progress Notes (Signed)
SITE AREA: right femoral/groin  SITE PRIOR TO REMOVAL:  LEVEL 0  PRESSURE APPLIED FOR: approximately 20 minutes  MANUAL: yes  PATIENT STATUS DURING PULL: wnl  POST PULL SITE:  LEVEL 0  POST PULL INSTRUCTIONS GIVEN: yes  POST PULL PULSES PRESENT: pedal pulses at +2 bilaterally  DRESSING APPLIED: gauze with tegaderm  BEDREST BEGINS @ 1203  COMMENTS:

## 2020-02-23 NOTE — Interval H&P Note (Signed)
Cath Lab Visit (complete for each Cath Lab visit)  Clinical Evaluation Leading to the Procedure:   ACS: Yes.    Non-ACS:    Anginal Classification: CCS IV  Anti-ischemic medical therapy: Minimal Therapy (1 class of medications)  Non-Invasive Test Results: No non-invasive testing performed  Prior CABG: No previous CABG      History and Physical Interval Note:  02/23/2020 9:26 AM  Cassidy Sanchez  has presented today for surgery, with the diagnosis of nstemi.  The various methods of treatment have been discussed with the patient and family. After consideration of risks, benefits and other options for treatment, the patient has consented to  Procedure(s): LEFT HEART CATH AND CORONARY ANGIOGRAPHY (N/A) as a surgical intervention.  The patient's history has been reviewed, patient examined, no change in status, stable for surgery.  I have reviewed the patient's chart and labs.  Questions were answered to the patient's satisfaction.     Nicki Guadalajara

## 2020-02-23 NOTE — ED Notes (Signed)
MD Delo made aware of Patients elevated Troponin. No further orders at this time besides another Troponin Specimen Collection at 0600, 02/23/2020

## 2020-02-23 NOTE — ED Triage Notes (Signed)
Pt BIB POV from Home with CP  Patient began having CP at 2300 yesterday along with Nausea, Vomiting, and Back Pain.  Medical Hx: HTN (Treated)  A&Ox4, GCS 15

## 2020-02-23 NOTE — ED Notes (Signed)
EDP Delo made aware of patients elevated Troponin. Verbal Order given to administer 324 mg of Chewable ASA. Upon entering the Examination Room to obtain second Troponin Specimen and administer PO ASA, patient stated she was having CP (5/10 from 2/10 previously). EDP Delo made aware and second EKG presented to EDP Delo. RN will continue to monitor.

## 2020-02-23 NOTE — ED Notes (Signed)
MD Delo made aware of elevated Troponin. No further orders at this time besides continued monitoring and planned transfer to [facility] Emergency Department for further consultation and intervention.

## 2020-02-23 NOTE — ED Provider Notes (Addendum)
[facility] EMERGENCY DEPARTMENT Provider Note   CSN: [id number] Arrival date & time: 02/23/20  0125     History Chief Complaint  Patient presents with  . Chest Pain    Cassidy Sanchez is a 50 y.o. female.  Patient is a 50 year old female with history of SVT, hypertension, prior cholecystectomy, hysterectomy. She presents today for evaluation of chest pain, nausea, and vomiting. Patient states she got home from being out. On her way home, she stopped and picked up Chipot[employer] which she ate. She then took a shower. Upon exiting the shower, she got the sudden onset of nausea, feeling lightheaded, tight in the chest, dizzy, then vomited multiple times. She felt short of breath and broke out in a sweat during this episode. This lasted for several minutes, then seemed to improve somewhat. She still has mild symptoms, but is improved from what she experienced earlier. Patient has no prior cardiac history. She denies any recent exertional symptoms.  The history is provided by the patient.  Chest Pain Pain location:  Substernal area Pain quality: tightness   Pain radiates to:  Does not radiate Pain severity:  Moderate Onset quality:  Sudden Timing:  Constant Progression:  Partially resolved Chronicity:  New      Past Medical History:  Diagnosis Date  . Family history of anesthesia complication    mom nausea and vomited;confusion  . Headache(784.0)    sinus  . History of shingles   . Hypertension   . Joint swelling    right ankle  . PONV (postoperative nausea and vomiting)   . Seasonal allergies    takes Zyrtec D as needed  . SVT (supraventricular tachycardia) (HCC)    takes Lopressor daily    Patient Active Problem List   Diagnosis Date Noted  . HYPERLIPIDEMIA 10/04/2009  . 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 GLUCOSE INTOLERANCE 10/03/2009  . HYPOKALEMIA 10/03/2009  . ANEMIA 10/03/2009  . HYPERTENSION 10/03/2009  . UTI 10/03/2009  . DIZZINESS 10/03/2009  . SLEEP  APNEA 10/03/2009  . SUPRAVENTRICULAR TACHYCARDIA, HX OF 10/03/2009    Past Surgical History:  Procedure Laterality Date  . ANKLE SURGERY Right   . CESAREAN SECTION    . CHOLECYSTECTOMY N/A 04/13/2013   Procedure: LAPAROSCOPIC CHOLECYSTECTOMY;  Surgeon: Atilano Ina, MD;  Location: [facility] OR;  Service: General;  Laterality: N/A;  . CHOLECYSTECTOMY    . NASAL SEPTUM SURGERY    . TONSILLECTOMY    . TUBAL LIGATION    . uvula removed    . VAGINAL HYSTERECTOMY       OB History    Gravida  2   Para  2   Term      Preterm      AB      Living  2     SAB      IAB      Ectopic      Multiple      Live Births              No family history on file.  Social History   Tobacco Use  . Smoking status: Never Smoker  . Smokeless tobacco: Never Used  Substance Use Topics  . Alcohol use: No  . Drug use: No    Home Medications Prior to Admission medications   Medication Sig Start Date End Date Taking? Authorizing Provider  acetaminophen (TYLENOL) 500 MG tablet Take 500 mg by mouth every 6 (six) hours as needed.    [provider]  amoxicillin (AMOXIL) 500 MG capsule Take 2 capsules (1,000 mg total) by mouth 2 (two) times daily. 12/17/14   Elpidio Anis, PA-C  fluticasone (FLONASE) 50 MCG/ACT nasal spray Place 1 spray into both nostrils daily.    [provider]  hydrochlorothiazide (HYDRODIURIL) 25 MG tablet Take 25 mg by mouth daily. 11/16/14   [provider]  metoprolol succinate (TOPROL-XL) 100 MG 24 hr tablet Take 100 mg by mouth every morning. 09/13/14   [provider]    Allergies    Erythromycin, Latex, Sulfa antibiotics, and Adhesive [tape]  Review of Systems   Review of Systems  Cardiovascular: Positive for chest pain.  All other systems reviewed and are negative.   Physical Exam Updated Vital Signs BP (!) 161/89 (BP Location: Right Arm)   Pulse 90   Temp 98.4 F (36.9 C) (Oral)   Resp 14   Ht 5\' 4"  (1.626 m)   Wt  106.1 kg   SpO2 100%   BMI 40.17 kg/m   Physical Exam Vitals and nursing note reviewed.  Constitutional:      General: She is not in acute distress.    Appearance: She is well-developed and well-nourished. She is not diaphoretic.  HENT:     Head: Normocephalic and atraumatic.  Cardiovascular:     Rate and Rhythm: Normal rate and regular rhythm.     Heart sounds: No murmur heard. No friction rub. No gallop.   Pulmonary:     Effort: Pulmonary effort is normal. No respiratory distress.     Breath sounds: Normal breath sounds. No wheezing.  Abdominal:     General: Bowel sounds are normal. There is no distension.     Palpations: Abdomen is soft.     Tenderness: There is no abdominal tenderness.  Musculoskeletal:        General: Normal range of motion.     Cervical back: Normal range of motion and neck supple.     Right lower leg: No tenderness. No edema.     Left lower leg: No tenderness. No edema.  Skin:    General: Skin is warm and dry.  Neurological:     Mental Status: She is alert and oriented to person, place, and time.     ED Results / Procedures / Treatments   Labs (all labs ordered are listed, but only abnormal results are displayed) Labs Reviewed  CBC - Abnormal; Notable for the following components:      Result Value   WBC 16.5 (*)    All other components within normal limits  BASIC METABOLIC PANEL  PREGNANCY, URINE  TROPONIN I (HIGH SENSITIVITY)    EKG ED ECG REPORT   Date: 02/23/2020  Rate: 82  Rhythm: normal sinus rhythm  QRS Axis: normal  Intervals: normal  ST/T Wave abnormalities: normal  Conduction Disutrbances:none  Narrative Interpretation:   Old EKG Reviewed: none available  I have personally reviewed the EKG tracing and agree with the computerized printout as noted.      Radiology No results found.  Procedures Procedures   Medications Ordered in ED Medications  ketorolac (TORADOL) 30 MG/ML injection 30 mg (has no administration  in time range)  ondansetron (ZOFRAN) injection 4 mg (has no administration in time range)  sodium chloride 0.9 % bolus 1,000 mL (has no administration in time range)    ED Course  I have reviewed the triage vital signs and the nursing notes.  Pertinent labs & imaging results that were available during  my care of the patient were reviewed by me and considered in my medical decision making (see chart for details).    MDM Rules/Calculators/A&P  Patient presenting here with complaints of chest discomfort as described in the HPI.  Her symptoms have improved significantly upon arrival.  Vitals are stable.  Initial EKG showing no acute change.  Laboratory studies obtained and unfortunately reveal a troponin of 119.  This was discussed with Dr. Julianne Handler from cardiology.  Patient to be transferred from here to [facility] for further evaluation.  Nitroglycerin paste applied and heparin drip initiated.  CRITICAL CARE Performed by: Geoffery Lyons Total critical care time: 35 minutes Critical care time was exclusive of separately billable procedures and treating other patients. Critical care was necessary to treat or prevent imminent or life-threatening deterioration. Critical care was time spent personally by me on the following activities: development of treatment plan with patient and/or surrogate as well as nursing, discussions with consultants, evaluation of patient's response to treatment, examination of patient, obtaining history from patient or surrogate, ordering and performing treatments and interventions, ordering and review of laboratory studies, ordering and review of radiographic studies, pulse oximetry and re-evaluation of patient's condition.  There appears to be a delay in obtaining an inpatient bed.  For this reason, patient will be made in ED to ED transfer so that her cardiology evaluation can be expedited.  I have spoken with Dr. Rosalia Hammers in the emergency department who agrees to accept in  transfer.   Final Clinical Impression(s) / ED Diagnoses Final diagnoses:  None    Rx / DC Orders ED Discharge Orders    None       Geoffery Lyons, MD 02/23/20 6073    Geoffery Lyons, MD 02/23/20 [phone]

## 2020-02-23 NOTE — ED Notes (Signed)
NitroGLYCERIN Ointment applied as patient reported 4/10 mid CP. MD Delo made aware.

## 2020-02-24 ENCOUNTER — Encounter (HOSPITAL_COMMUNITY): Payer: Self-pay | Admitting: Cardiovascular Disease

## 2020-02-24 DIAGNOSIS — E782 Mixed hyperlipidemia: Secondary | ICD-10-CM

## 2020-02-24 DIAGNOSIS — Z9889 Other specified postprocedural states: Secondary | ICD-10-CM

## 2020-02-24 DIAGNOSIS — D72829 Elevated white blood cell count, unspecified: Secondary | ICD-10-CM

## 2020-02-24 DIAGNOSIS — I2510 Atherosclerotic heart disease of native coronary artery without angina pectoris: Secondary | ICD-10-CM | POA: Diagnosis present

## 2020-02-24 HISTORY — DX: Other specified postprocedural states: Z98.890

## 2020-02-24 HISTORY — DX: Elevated white blood cell count, unspecified: D72.829

## 2020-02-24 LAB — BASIC METABOLIC PANEL
Anion gap: 6 (ref 5–15)
BUN: <5 mg/dL — ABNORMAL LOW (ref 6–20)
CO2: 29 mmol/L (ref 22–32)
Calcium: 8.8 mg/dL — ABNORMAL LOW (ref 8.9–10.3)
Chloride: 105 mmol/L (ref 98–111)
Creatinine, Ser: 0.68 mg/dL (ref 0.44–1.00)
GFR, Estimated: >60 mL/min (ref >60)
Glucose, Bld: 132 mg/dL — ABNORMAL HIGH (ref 70–99)
Potassium: 3.8 mmol/L (ref 3.5–5.1)
Sodium: 140 mmol/L (ref 135–145)

## 2020-02-24 LAB — CBC
HCT: 36.6 % (ref 36.0–46.0)
Hemoglobin: 11.9 g/dL — ABNORMAL LOW (ref 12.0–15.0)
MCH: 30 pg (ref 26.0–34.0)
MCHC: 32.5 g/dL (ref 30.0–36.0)
MCV: 92.2 fL (ref 80.0–100.0)
Platelets: 341 10*3/uL (ref 150–400)
RBC: 3.97 MIL/uL (ref 3.87–5.11)
RDW: 12.4 % (ref 11.5–15.5)
WBC: 10.3 10*3/uL (ref 4.0–10.5)
nRBC: 0 % (ref 0.0–0.2)

## 2020-02-24 LAB — HEMOGLOBIN A1C
Hgb A1c MFr Bld: 6.3 % — ABNORMAL HIGH (ref 4.8–5.6)
Mean Plasma Glucose: 134.11 mg/dL

## 2020-02-24 MED ORDER — VERAPAMIL HCL ER 240 MG PO TBCR: 240.0000 mg | EXTENDED_RELEASE_TABLET | Freq: Every day | ORAL | 6 refills | Status: DC

## 2020-02-24 MED ORDER — ASPIRIN 81 MG PO CHEW: 81.0000 mg | CHEWABLE_TABLET | Freq: Every day | ORAL | Status: DC

## 2020-02-24 MED ORDER — NITROGLYCERIN 0.4 MG SL SUBL: 0.4000 mg | SUBLINGUAL_TABLET | SUBLINGUAL | 4 refills | Status: AC | PRN

## 2020-02-24 MED ORDER — VERAPAMIL HCL ER 120 MG PO TBCR
120.0000 mg | EXTENDED_RELEASE_TABLET | Freq: Once | ORAL | Status: AC
  Administered 2020-02-24: 120 mg via ORAL
  Filled 2020-02-24: qty 1

## 2020-02-24 MED ORDER — NITROGLYCERIN 0.4 MG SL SUBL: 0.4000 mg | SUBLINGUAL_TABLET | SUBLINGUAL | Status: DC | PRN

## 2020-02-24 MED ORDER — LISINOPRIL 10 MG PO TABS: 10.0000 mg | ORAL_TABLET | Freq: Every day | ORAL | 6 refills | Status: DC

## 2020-02-24 MED ORDER — CLOPIDOGREL BISULFATE 75 MG PO TABS: 75.0000 mg | ORAL_TABLET | Freq: Every day | ORAL | 11 refills | Status: DC

## 2020-02-24 MED ORDER — LISINOPRIL 10 MG PO TABS
10.0000 mg | ORAL_TABLET | Freq: Every day | ORAL | Status: DC
  Administered 2020-02-24: 10 mg via ORAL
  Filled 2020-02-24: qty 1

## 2020-02-24 MED ORDER — VERAPAMIL HCL ER 240 MG PO TBCR: 240.0000 mg | EXTENDED_RELEASE_TABLET | Freq: Every day | ORAL | Status: DC

## 2020-02-24 MED ORDER — ATORVASTATIN CALCIUM 80 MG PO TABS: 80.0000 mg | ORAL_TABLET | Freq: Every day | ORAL | 6 refills | Status: DC

## 2020-02-24 NOTE — Plan of Care (Signed)

## 2020-02-24 NOTE — Progress Notes (Signed)
Progress Note  Patient Name: Cassidy Sanchez Date of Encounter: 02/24/2020  [facility] Cardiologist: Dr Tresa Endo  Subjective   The patient denies any chest pain or shortness of breath this morning, she was able to walk around without symptoms.  Inpatient Medications    Scheduled Meds: . amitriptyline  10 mg Oral QHS  . aspirin  81 mg Oral Daily  . atorvastatin  80 mg Oral QHS  . atorvastatin  80 mg Oral Daily  . buPROPion  150 mg Oral Daily  . clopidogrel  75 mg Oral Q breakfast  . sodium chloride flush  3 mL Intravenous Q12H  . verapamil  120 mg Oral Daily   Continuous Infusions: . sodium chloride    . sodium chloride 125 mL/hr at 02/23/20 1211  . sodium chloride    . sodium chloride 1 mL/kg/hr (02/23/20 1902)   PRN Meds: sodium chloride, sodium chloride, acetaminophen, alum & mag hydroxide-simeth, diazepam, nitroGLYCERIN, ondansetron (ZOFRAN) IV, sodium chloride flush   Vital Signs    Vitals:   02/23/20 2345 02/24/20 0335 02/24/20 0413 02/24/20 0833  BP: (!) 149/91 (!) 138/93  (!) 152/93  Pulse: 99 97  93  Resp: (!) 23 15  15   Temp: 97.8 F (36.6 C) 98.1 F (36.7 C)    TempSrc: Oral Oral    SpO2: 95% 98%  98%  Weight:   108 kg   Height:        Intake/Output Summary (Last 24 hours) at 02/24/2020 1047 Last data filed at 02/24/2020 0300 Gross per 24 hour  Intake 2200.32 ml  Output --  Net 2200.32 ml   Last 3 Weights 02/24/2020 02/23/2020 12/12/2017  Weight (lbs) 238 lb 234 lb 215 lb  Weight (kg) 107.956 kg 106.142 kg 97.523 kg      Telemetry    Sinus rhythm with ventricular rates in 90' - Personally Reviewed  ECG    Sinus rhythm, normal EKG- Personally Reviewed  Physical Exam   GEN: No acute distress.   Neck: No JVD Cardiac: RRR, no murmurs, rubs, or gallops.  Respiratory: Clear to auscultation bilaterally. GI: Soft, nontender, non-distended  MS: No edema; No deformity. Neuro:  Nonfocal  Psych: Normal affect   Labs    High Sensitivity  Troponin:   Recent Labs  Lab 02/23/20 0152 02/23/20 0358 02/23/20 0553  TROPONINIHS 119* 1,617* 2,377*      Chemistry Recent Labs  Lab 02/23/20 0152 02/24/20 0258  NA 135 140  K 3.1* 3.8  CL 100 105  CO2 25 29  GLUCOSE 213* 132*  BUN 10 <5*  CREATININE 0.70 0.68  CALCIUM 9.1 8.8*  GFRNONAA >60 >60  ANIONGAP 10 6     Hematology Recent Labs  Lab 02/23/20 0152 02/24/20 0258  WBC 16.5* 10.3  RBC 4.39 3.97  HGB 13.2 11.9*  HCT 38.5 36.6  MCV 87.7 92.2  MCH 30.1 30.0  MCHC 34.3 32.5  RDW 11.9 12.4  PLT 370 341    BNPNo results for input(s): BNP, PROBNP in the last 168 hours.   DDimer No results for input(s): DDIMER in the last 168 hours.   Radiology    DG Chest 2 View  Result Date: 02/23/2020 CLINICAL DATA:  Chest pain. EXAM: CHEST - 2 VIEW COMPARISON:  04/06/2013 FINDINGS: The cardiomediastinal contours are normal. The lungs are clear. Pulmonary vasculature is normal. No consolidation, pleural effusion, or pneumothorax. No acute osseous abnormalities are seen. IMPRESSION: No acute chest findings. Electronically Signed   By: 06/06/2013  Sanford M.D.   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  The left ventricular systolic function is normal.  LV end diastolic pressure is normal.  The left ventricular ejection fraction is 55-65% by visual estimate.  No evidence for significant coronary atherosclerotic disease but there is a suggestion of very distal apical LAD pruning raising concern for vasospasm versus potential distal SCAD.  Normal LAD and dominant RCA vessels. Normal LV function with EF estimated at 55 to 60% and LVEDP 18 mm. RECOMMENDATION: We will review with colleagues.  High-sensitivity troponin enzymes had increased to 2377 prior to the procedure.  This most likely is due to the apical LAD vessel pruning which may be contributed by vasospasm versus SCAD. The patient had significant spasm of her  radial and brachial arteries during the procedure which did not respond to increasing doses of verapamil and intra-arterial NTG necessitating transition from the radial to the femoral approach.  Will obtain 2D echo Doppler study.  With positive troponins we will initiate clopidogril/aspirin.  We will also initiate verapamil with the patient's history of SVT which will be helpful both for blood pressure as well as potential coronary vasospasm.    ECHOCARDIOGRAM COMPLETE  Result Date: 02/23/2020    ECHOCARDIOGRAM REPORT   Patient Name:   Cassidy Sanchez Date of Exam: 02/23/2020 Medical Rec #:  [id number]          Height: Accession #:    [id number]         Weight: Date of Birth:  04/09/1970          BSA: Patient Age:    49 years           BP:           137/71 mmHg Patient Gender: F                  HR:           91 bpm. Exam Location:  Inpatient Procedure: 2D Echo, Cardiac Doppler and Color Doppler Indications:    Elevated Troponin  History:        Patient has no prior history of Echocardiogram examinations.                 Signs/Symptoms:Chest Pain, Risk Factors:Hypertension and                 Dyslipidemia.; Medications:Supraventricular tachycardia.  Sonographer:    Leta Jungling RDCS Referring Phys: [id number] Beatriz Stallion IMPRESSIONS  1. Left ventricular ejection fraction, by estimation, is 65 to 70%. The left ventricle has normal function. The left ventricle has no regional wall motion abnormalities. There is mild left ventricular hypertrophy. Left ventricular diastolic parameters are indeterminate.  2. Right ventricular systolic function is normal. The right ventricular size is moderately enlarged.  3. The mitral valve is normal in structure. No evidence of mitral valve regurgitation. No evidence of mitral stenosis.  4. The aortic valve is normal in structure. Aortic valve regurgitation is not visualized. No aortic stenosis is present.  5. The inferior vena cava is normal in size with greater than 50%  respiratory variability, suggesting right atrial pressure of 3 mmHg. FINDINGS  Left Ventricle: Left ventricular ejection fraction, by estimation, is 65 to 70%. The left ventricle has normal function. The left ventricle has no regional wall motion abnormalities. The left ventricular internal cavity size was normal in size. There is  mild left ventricular hypertrophy.  Left ventricular diastolic parameters are indeterminate. Right Ventricle: The right ventricular size is moderately enlarged. No increase in right ventricular wall thickness. Right ventricular systolic function is normal. Left Atrium: Left atrial size was normal in size. Right Atrium: Right atrial size was normal in size. Pericardium: There is no evidence of pericardial effusion. Mitral Valve: The mitral valve is normal in structure. No evidence of mitral valve regurgitation. No evidence of mitral valve stenosis. Tricuspid Valve: The tricuspid valve is normal in structure. Tricuspid valve regurgitation is not demonstrated. No evidence of tricuspid stenosis. Aortic Valve: The aortic valve is normal in structure. Aortic valve regurgitation is not visualized. No aortic stenosis is present. Pulmonic Valve: The pulmonic valve was normal in structure. Pulmonic valve regurgitation is not visualized. No evidence of pulmonic stenosis. Aorta: The aortic root is normal in size and structure. Venous: The inferior vena cava is normal in size with greater than 50% respiratory variability, suggesting right atrial pressure of 3 mmHg. IAS/Shunts: No atrial level shunt detected by color flow Doppler. Donato Schultz MD Electronically signed by Donato Schultz MD Signature Date/Time: 02/23/2020/3:10:27 PM    Final     Cardiac Studies   TTE; 02/23/2020  1. Left ventricular ejection fraction, by estimation, is 65 to 70%. The  left ventricle has normal function. The left ventricle has no regional  wall motion abnormalities. There is mild left ventricular hypertrophy.  Left  ventricular diastolic parameters  are indeterminate.  2. Right ventricular systolic function is normal. The right ventricular  size is moderately enlarged.  3. The mitral valve is normal in structure. No evidence of mitral valve  regurgitation. No evidence of mitral stenosis.  4. The aortic valve is normal in structure. Aortic valve regurgitation is  not visualized. No aortic stenosis is present.  5. The inferior vena cava is normal in size with greater than 50%  respiratory variability, suggesting right atrial pressure of 3 mmHg.   Patient Profile     50 y.o. female   Assessment & Plan    1. NSTEMI:  - cath yesterday showed no evidence for significant coronary atherosclerotic disease but there is a suggestion of very distal apical LAD pruning raising concern for vasospasm versus potential distal SCAD.  Normal LAD and dominant RCA vessels. Normal LV function with EF estimated at 55 to 60% and LVEDP 18 mm. -Site in the right radial artery without any bleeding or bruising, with good pulses and no bruit - Troponin 1617 ->  2377 prior to the procedure, no t checked since then -continue clopidogril/aspirin for at least a year - started on verapamil with the patient's history of SVT which will be helpful both for blood pressure as well as potential coronary vasospasm, her blood pressure and heart rate still elevated, I will increase verapamil to 240 mg daily and give additional dose of 120 mg today. -We will also prescribe sublingual nitroglycerin as needed for pain at home.  2. HTN: BP generally elevated.  - Will resume low-dose lisinopril 10 mg daily, uptitrate as needed at the outpatient visit  3. HLD: LDL 159 02/15/20; recently started on simvastatin 20mg  daily. - Will start atorvastatin 80mg  daily, repeat lipids and LFTs in 4 weeks.  4. History of SVT: No recent complaints of palpitations.  - Will monitor on telemetry  5. Leukocytosis: WBC elevated to 16.5 on admission labs.  Possible this is reactive in the setting of #1. No fevers. No infectious symptoms. COVID-19 and influenza negative.  - Continue to monitor WBC/fever  trend  The patient can be discharged today, will arrange for outpatient follow-up within next 10 days.  For questions or updates, please contact [facility] Please consult [email or web address] for contact info under    Signed, Tobias AlexanderKatarina Trace Wirick, MD  02/24/2020, 10:47 AM

## 2020-02-24 NOTE — Discharge Summary (Addendum)
Discharge Summary    Patient ID: Cassidy Sanchez MRN: [id number]; DOB: 1970/10/27  Admit date: 02/23/2020 Discharge date: 02/24/2020  PCP:  Gillian Scarce   [facility]  Cardiologist:  Nicki Guadalajara, MD  Advanced Practice Provider:  No care team member to display Electrophysiologist:  None        Discharge Diagnoses    Principal Problem:   ACS (acute coronary syndrome) [facility]) Active Problems:   NSTEMI (non-ST elevated myocardial infarction) (HCC)   S/P cardiac cath 02/23/20 with non obstructive disease but suggestion of vasopsasm vs potential distal SCAD   Essential hypertension   Paroxysmal SVT (supraventricular tachycardia) (HCC)   CAD in native artery   Leukocytosis   Diagnostic Studies/Procedures    Echo 02/23/20 IMPRESSIONS    1. Left ventricular ejection fraction, by estimation, is 65 to 70%. The  left ventricle has normal function. The left ventricle has no regional  wall motion abnormalities. There is mild left ventricular hypertrophy.  Left ventricular diastolic parameters  are indeterminate.  2. Right ventricular systolic function is normal. The right ventricular  size is moderately enlarged.  3. The mitral valve is normal in structure. No evidence of mitral valve  regurgitation. No evidence of mitral stenosis.  4. The aortic valve is normal in structure. Aortic valve regurgitation is  not visualized. No aortic stenosis is present.  5. The inferior vena cava is normal in size with greater than 50%  respiratory variability, suggesting right atrial pressure of 3 mmHg.   FINDINGS  Left Ventricle: Left ventricular ejection fraction, by estimation, is 65  to 70%. The left ventricle has normal function. The left ventricle has no  regional wall motion abnormalities. The left ventricular internal cavity  size was normal in size. There is  mild left ventricular hypertrophy. Left ventricular diastolic parameters  are  indeterminate.   Right Ventricle: The right ventricular size is moderately enlarged. No  increase in right ventricular wall thickness. Right ventricular systolic  function is normal.   Left Atrium: Left atrial size was normal in size.   Right Atrium: Right atrial size was normal in size.   Pericardium: There is no evidence of pericardial effusion.   Mitral Valve: The mitral valve is normal in structure. No evidence of  mitral valve regurgitation. No evidence of mitral valve stenosis.   Tricuspid Valve: The tricuspid valve is normal in structure. Tricuspid  valve regurgitation is not demonstrated. No evidence of tricuspid  stenosis.   Aortic Valve: The aortic valve is normal in structure. Aortic valve  regurgitation is not visualized. No aortic stenosis is present.   Pulmonic Valve: The pulmonic valve was normal in structure. Pulmonic valve  regurgitation is not visualized. No evidence of pulmonic stenosis.   Aorta: The aortic root is normal in size and structure.   Venous: The inferior vena cava is normal in size with greater than 50%  respiratory variability, suggesting right atrial pressure of 3 mmHg.   IAS/Shunts: No atrial level shunt detected by color flow Doppler.  _____________   Cardiac cath 02/23/20   Dist LAD lesion is 95% stenosed.  The left ventricular systolic function is normal.  LV end diastolic pressure is normal.  The left ventricular ejection fraction is 55-65% by visual estimate.   No evidence for significant coronary atherosclerotic disease but there is a suggestion of very distal apical LAD pruning raising concern for vasospasm versus potential distal SCAD.  Normal LAD and dominant RCA vessels.  Normal LV  function with EF estimated at 55 to 60% and LVEDP 18 mm.  RECOMMENDATION: We will review with colleagues.  High-sensitivity troponin enzymes had increased to 2377 prior to the procedure.  This most likely is due to the apical LAD vessel pruning  which may be contributed by vasospasm versus SCAD. The patient had significant spasm of her radial and brachial arteries during the procedure which did not respond to increasing doses of verapamil and intra-arterial NTG necessitating transition from the radial to the femoral approach.  Will obtain 2D echo Doppler study.  With positive troponins we will initiate clopidogril/aspirin.  We will also initiate verapamil with the patient's history of SVT which will be helpful both for blood pressure as well as potential coronary vasospasm.     History of Present Illness     Cassidy Sanchez is a 50 y.o. female with PMH of HTN, HLD, and SVT presented 02/23/20 with chest pain that began evening of 02/22/20.  She ate then took shower and developed sudden onset nausea, lightheadedness and chest tightness and emesis.  No SOB.  In ER NTG did relieve pain.  Her troponin did pk at 2377.  She was COVID neg.  EKG with SR at 82, NO ST changes and repeat EKG with non specific ST abnormalities.   CXR stable. Pt was placed on IV heparin and urgent cath was arranged after discussing with pt.    Hospital Course     Consultants: none   Cardiac cath as above.  Echo was stable.  Pt placed on ASA and plavix and verapamil for hx SVT.  This may help BP and potential coronary spasm. She will also have SL NTG prn.    Today she was seen by Dr. Delton See and found stable for discharge.  Will have her follow up in next 10 days in office.  Cardiac rehab has not seen will defer to outpt cards about referral.   WBC was 16.5 but now down to 10.3.  COVID neg preg test neg. K+ on admit 3.1 but at discharge 3.8.    Did the patient have an acute coronary syndrome (MI, NSTEMI, STEMI, etc) this admission?:  Yes                               AHA/ACC Clinical Performance & Quality Measures: 1. Aspirin prescribed? - Yes 2. ADP Receptor Inhibitor (Plavix/Clopidogrel, Brilinta/Ticagrelor or Effient/Prasugrel) prescribed (includes medically  managed patients)? - Yes 3. Beta Blocker prescribed? - No - vasospasm 4. High Intensity Statin (Lipitor 40-80mg  or Crestor 20-40mg ) prescribed? - Yes 5. EF assessed during THIS hospitalization? - Yes 6. For EF <40%, was ACEI/ARB prescribed? - Not Applicable (EF >/= 40%) 7. For EF <40%, Aldosterone Antagonist (Spironolactone or Eplerenone) prescribed? - Not Applicable (EF >/= 40%) 8. Cardiac Rehab Phase II ordered (including medically managed patients)? - No - no PCI, will discuss as outpt        _____________  Discharge Vitals Blood pressure (!) 152/93, pulse 93, temperature 98.1 F (36.7 C), temperature source Oral, resp. rate 15, height 5\' 4"  (1.626 m), weight 108 kg, SpO2 98 %.  Filed Weights   02/23/20 0139 02/24/20 0413  Weight: 106.1 kg 108 kg    Labs & Radiologic Studies    CBC Recent Labs    02/23/20 0152 02/24/20 0258  WBC 16.5* 10.3  HGB 13.2 11.9*  HCT 38.5 36.6  MCV 87.7 92.2  PLT 370 341  Basic Metabolic Panel Recent Labs    16/10/96 0152 02/24/20 0258  NA 135 140  K 3.1* 3.8  CL 100 105  CO2 25 29  GLUCOSE 213* 132*  BUN 10 <5*  CREATININE 0.70 0.68  CALCIUM 9.1 8.8*   Liver Function Tests No results for input(s): AST, ALT, ALKPHOS, BILITOT, PROT, ALBUMIN in the last 72 hours. No results for input(s): LIPASE, AMYLASE in the last 72 hours. High Sensitivity Troponin:   Recent Labs  Lab 02/23/20 0152 02/23/20 0358 02/23/20 0553  TROPONINIHS 119* 1,617* 2,377*    BNP Invalid input(s): POCBNP D-Dimer No results for input(s): DDIMER in the last 72 hours. Hemoglobin A1C Recent Labs    02/24/20 0258  HGBA1C 6.3*   Fasting Lipid Panel No results for input(s): CHOL, HDL, LDLCALC, TRIG, CHOLHDL, LDLDIRECT in the last 72 hours. Thyroid Function Tests No results for input(s): TSH, T4TOTAL, T3FREE, THYROIDAB in the last 72 hours.  Invalid input(s): FREET3 _____________  DG Chest 2 View  Result Date: 02/23/2020 CLINICAL DATA:  Chest pain.  EXAM: CHEST - 2 VIEW COMPARISON:  04/06/2013 FINDINGS: The cardiomediastinal contours are normal. The lungs are clear. Pulmonary vasculature is normal. No consolidation, pleural effusion, or pneumothorax. No acute osseous abnormalities are seen. IMPRESSION: No acute chest findings. Electronically Signed   By: Narda Rutherford M.D.   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  The left ventricular systolic function is normal.  LV end diastolic pressure is normal.  The left ventricular ejection fraction is 55-65% by visual estimate.  No evidence for significant coronary atherosclerotic disease but there is a suggestion of very distal apical LAD pruning raising concern for vasospasm versus potential distal SCAD.  Normal LAD and dominant RCA vessels. Normal LV function with EF estimated at 55 to 60% and LVEDP 18 mm. RECOMMENDATION: We will review with colleagues.  High-sensitivity troponin enzymes had increased to 2377 prior to the procedure.  This most likely is due to the apical LAD vessel pruning which may be contributed by vasospasm versus SCAD. The patient had significant spasm of her radial and brachial arteries during the procedure which did not respond to increasing doses of verapamil and intra-arterial NTG necessitating transition from the radial to the femoral approach.  Will obtain 2D echo Doppler study.  With positive troponins we will initiate clopidogril/aspirin.  We will also initiate verapamil with the patient's history of SVT which will be helpful both for blood pressure as well as potential coronary vasospasm.    ECHOCARDIOGRAM COMPLETE  Result Date: 02/23/2020    ECHOCARDIOGRAM REPORT   Patient Name:   Cassidy Sanchez Date of Exam: 02/23/2020 Medical Rec #:  [id number]          Height: Accession #:    [id number]         Weight: Date of Birth:  08/23/1970          BSA: Patient Age:    49 years           BP:           137/71 mmHg  Patient Gender: F                  HR:           91 bpm. Exam Location:  Inpatient Procedure: 2D Echo, Cardiac Doppler and Color Doppler Indications:    Elevated Troponin  History:  Patient has no prior history of Echocardiogram examinations.                 Signs/Symptoms:Chest Pain, Risk Factors:Hypertension and                 Dyslipidemia.; Medications:Supraventricular tachycardia.  Sonographer:    Leta Jungling RDCS Referring Phys: [id number] Beatriz Stallion IMPRESSIONS  1. Left ventricular ejection fraction, by estimation, is 65 to 70%. The left ventricle has normal function. The left ventricle has no regional wall motion abnormalities. There is mild left ventricular hypertrophy. Left ventricular diastolic parameters are indeterminate.  2. Right ventricular systolic function is normal. The right ventricular size is moderately enlarged.  3. The mitral valve is normal in structure. No evidence of mitral valve regurgitation. No evidence of mitral stenosis.  4. The aortic valve is normal in structure. Aortic valve regurgitation is not visualized. No aortic stenosis is present.  5. The inferior vena cava is normal in size with greater than 50% respiratory variability, suggesting right atrial pressure of 3 mmHg. FINDINGS  Left Ventricle: Left ventricular ejection fraction, by estimation, is 65 to 70%. The left ventricle has normal function. The left ventricle has no regional wall motion abnormalities. The left ventricular internal cavity size was normal in size. There is  mild left ventricular hypertrophy. Left ventricular diastolic parameters are indeterminate. Right Ventricle: The right ventricular size is moderately enlarged. No increase in right ventricular wall thickness. Right ventricular systolic function is normal. Left Atrium: Left atrial size was normal in size. Right Atrium: Right atrial size was normal in size. Pericardium: There is no evidence of pericardial effusion. Mitral Valve: The mitral  valve is normal in structure. No evidence of mitral valve regurgitation. No evidence of mitral valve stenosis. Tricuspid Valve: The tricuspid valve is normal in structure. Tricuspid valve regurgitation is not demonstrated. No evidence of tricuspid stenosis. Aortic Valve: The aortic valve is normal in structure. Aortic valve regurgitation is not visualized. No aortic stenosis is present. Pulmonic Valve: The pulmonic valve was normal in structure. Pulmonic valve regurgitation is not visualized. No evidence of pulmonic stenosis. Aorta: The aortic root is normal in size and structure. Venous: The inferior vena cava is normal in size with greater than 50% respiratory variability, suggesting right atrial pressure of 3 mmHg. IAS/Shunts: No atrial level shunt detected by color flow Doppler. Donato Schultz MD Electronically signed by Donato Schultz MD Signature Date/Time: 02/23/2020/3:10:27 PM    Final    Disposition   Pt is being discharged home today in good condition.  Follow-up Plans & Appointments   Call [facility] at [phone] if any bleeding, swelling or drainage at cath site.  May shower, no tub baths for 48 hours for groin sticks. No lifting over 5 pounds for 3 days.  No Driving for 3 days   Take 1 NTG, under your tongue, while sitting.  If no relief of pain may repeat NTG, one tab every 5 minutes up to 3 tablets total over 15 minutes.  If no relief CALL 911.  If you have dizziness/lightheadness  while taking NTG, stop taking and call 911.        Heart Healthy Diet   We changed your cholesterol medication to lipitor stop zocor, decreased lisinopril because we added other meds that may affect BP as well.    We added meds see list.     No work for 1 week - may return after follow up visit.  Follow-up Information    Lennette BihariKelly, Thomas A, MD Follow up.   Specialty: Cardiology Why: his office should call you Monday to arrange date and time.  for appt Contact information: [address] [place] [address] [phone]                Discharge Medications   Allergies as of 02/24/2020      Reactions   Latex Hives, Itching   Erythromycin Hives   Sulfa Antibiotics Itching   Adhesive [tape] Rash   Paper tape ok.    Codeine Hives, Itching      Medication List    STOP taking these medications   meloxicam 15 MG tablet Commonly known as: MOBIC   potassium chloride 10 MEQ CR capsule Commonly known as: MICRO-K   simvastatin 20 MG tablet Commonly known as: ZOCOR     TAKE these medications   acetaminophen 500 MG tablet Commonly known as: TYLENOL Take 1,000 mg by mouth every 6 (six) hours as needed for headache or mild pain.   amitriptyline 10 MG tablet Commonly known as: ELAVIL Take 10 mg by mouth at bedtime as needed for sleep.   aspirin 81 MG chewable tablet Chew 1 tablet (81 mg total) by mouth daily. Start taking on: February 25, 2020   atorvastatin 80 MG tablet Commonly known as: LIPITOR Take 1 tablet (80 mg total) by mouth at bedtime.   buPROPion 150 MG 24 hr tablet Commonly known as: WELLBUTRIN XL Take 150 mg by mouth daily.   clopidogrel 75 MG tablet Commonly known as: PLAVIX Take 1 tablet (75 mg total) by mouth daily with breakfast. Start taking on: February 25, 2020   lisinopril 10 MG tablet Commonly known as: ZESTRIL Take 1 tablet (10 mg total) by mouth daily. What changed:   medication strength  how much to take   nitroGLYCERIN 0.4 MG SL tablet Commonly known as: NITROSTAT Place 1 tablet (0.4 mg total) under the tongue every 5 (five) minutes as needed for chest pain.   valACYclovir 500 MG tablet Commonly known as: VALTREX Take 500 mg by mouth 3 (three) times daily as needed (flare-up).   verapamil 240 MG CR tablet Commonly known as: CALAN-SR Take 1 tablet (240 mg total) by mouth daily. Start taking on: February 25, 2020   Vitamin D (Ergocalciferol) 1.25 MG (50000 UNIT) Caps capsule Commonly  known as: DRISDOL Take 50,000 Units by mouth every Sunday.          Outstanding Labs/Studies   BMP and CBC Possible cardiac rehab  Duration of Discharge Encounter   Greater than 30 minutes including physician time.  Signed, Nada BoozerLaura Ingold, NP 02/24/2020, 12:30 PM

## 2020-02-24 NOTE — Discharge Instructions (Signed)
Call [facility] at [phone] if any bleeding, swelling or drainage at cath site.  May shower, no tub baths for 48 hours for groin sticks. No lifting over 5 pounds for 3 days.  No Driving for 3 days   Take 1 NTG, under your tongue, while sitting.  If no relief of pain may repeat NTG, one tab every 5 minutes up to 3 tablets total over 15 minutes.  If no relief CALL 911.  If you have dizziness/lightheadness  while taking NTG, stop taking and call 911.        Heart Healthy Diet   We changed your cholesterol medication to lipitor stop zocor, decreased lisinopril because we added other meds that may affect BP as well.    We added meds see list.    No work for 1 week - may return after follow up visit.

## 2020-02-26 ENCOUNTER — Telehealth: Payer: Self-pay | Admitting: Cardiovascular Disease

## 2020-02-26 NOTE — Telephone Encounter (Signed)
Patient contacted regarding discharge from [facility] on 02/19.  Patient understands to follow up with provider Joni Reining, NP on 03/01/20 at 11:15 AM at [facility] office. Patient understands discharge instructions? yes Patient understands medications and regiment? yes Patient understands to bring all medications to this visit? yes

## 2020-02-26 NOTE — Telephone Encounter (Signed)
Patient is scheduled to see Cassidy Sanchez on 03/01/20 at 11:45am.

## 2020-02-29 NOTE — Progress Notes (Signed)
Cardiology Office Note   Date:  03/01/2020   ID:  Janace, Decker 09/01/1970, MRN [id number]  PCP:  Burnis Medin, PA-C  Cardiologist: Dr. Tresa Endo CC: Post hospital follow up   History of Present Illness: Cassidy Sanchez is a 50 y.o. female who presents for posthospitalization follow-up after admission on 02/22/2020 in the setting of chest pain.  The patient took a shower and developed sudden onset of nausea lightheadedness and chest tightness with emesis.  She has a history of hypertension, hyperlipidemia, SVT.  EKG revealed no ST changes initially, but an EKG repeated in the ED revealed nonspecific ST abnormalities.  Cardiac catheterization was planned and completed.  Catheterization revealed distal LAD of 95% with normal LVEF.  There is no evidence for significant coronary atherosclerotic disease but there was a suggestion of a very distal apical LAD pruning raising the concern for vasospasm versus potential distal SCAD.  In the meantime the patient was placed on clopidogrel and aspirin for at least 1 year, started on verapamil in the setting of history of SVT and vasospasm.  She was also given prescription for sublingual nitroglycerin for recurrent discomfort.  She was resumed on her low-dose lisinopril 10 mg daily which may need up titration today.  Simvastatin was discontinued and she was started on atorvastatin 80 mg daily with repeat lipids and LFTs to be completed today.  She comes today with multiple questions and expresses anxiety about her recent hospitalization and treatment regimen.  She works in [employer] and is under a lot of stress as well.  She states that she has had to take nitroglycerin twice she was released from the hospital.  The pain in her chest was not nearly as severe is that which led her to hospitalization, nor did she have any nausea or vomiting.  She is having some significant anxiety at home returning to the site of her recent heart attack.  She was taking a  shower and now she states when she goes into the bathroom she becomes very anxious and feels the walls closing in.   Past Medical History:  Diagnosis Date  . Family history of anesthesia complication    mom nausea and vomited;confusion  . Headache(784.0)    sinus  . History of shingles   . Hyperlipidemia   . Hypertension   . Joint swelling    right ankle  . Leukocytosis 02/24/2020  . PONV (postoperative nausea and vomiting)   . S/P cardiac cath 02/23/20 with non obstructive disease but suggestion of vasopsasm vs potential distal SCAD 02/24/2020  . Seasonal allergies    takes Zyrtec D as needed  . SVT (supraventricular tachycardia) (HCC)    takes Lopressor daily    Past Surgical History:  Procedure Laterality Date  . ANKLE SURGERY Right   . CESAREAN SECTION    . CHOLECYSTECTOMY N/A 04/13/2013   Procedure: LAPAROSCOPIC CHOLECYSTECTOMY;  Surgeon: Atilano Ina, MD;  Location: [facility];  Service: General;  Laterality: N/A;  . CHOLECYSTECTOMY    . LEFT HEART CATH AND CORONARY ANGIOGRAPHY N/A 02/23/2020   Procedure: LEFT HEART CATH AND CORONARY ANGIOGRAPHY;  Surgeon: Lennette Bihari, MD;  Location: [facility] INVASIVE CV LAB;  Service: Cardiovascular;  Laterality: N/A;  . NASAL SEPTUM SURGERY    . TONSILLECTOMY    . TUBAL LIGATION    . uvula removed    . VAGINAL HYSTERECTOMY       Current Outpatient Medications  Medication Sig Dispense Refill  . acetaminophen (TYLENOL) 500  MG tablet Take 1,000 mg by mouth every 6 (six) hours as needed for headache or mild pain.    Marland Kitchen amitriptyline (ELAVIL) 10 MG tablet Take 10 mg by mouth at bedtime as needed for sleep.    Marland Kitchen aspirin 81 MG chewable tablet Chew 1 tablet (81 mg total) by mouth daily.    Marland Kitchen atorvastatin (LIPITOR) 80 MG tablet Take 1 tablet (80 mg total) by mouth at bedtime. 30 tablet 6  . buPROPion (WELLBUTRIN XL) 150 MG 24 hr tablet Take 150 mg by mouth daily.    . clopidogrel (PLAVIX) 75 MG tablet Take 1 tablet (75 mg total) by mouth daily with  breakfast. 30 tablet 11  . lisinopril (ZESTRIL) 10 MG tablet Take 1 tablet (10 mg total) by mouth daily. 30 tablet 6  . nitroGLYCERIN (NITROSTAT) 0.4 MG SL tablet Place 1 tablet (0.4 mg total) under the tongue every 5 (five) minutes as needed for chest pain. 25 tablet 4  . valACYclovir (VALTREX) 500 MG tablet Take 500 mg by mouth 3 (three) times daily as needed (flare-up).    . verapamil (CALAN-SR) 240 MG CR tablet Take 1 tablet (240 mg total) by mouth daily. 30 tablet 6  . Vitamin D, Ergocalciferol, (DRISDOL) 1.25 MG (50000 UNIT) CAPS capsule Take 50,000 Units by mouth every Sunday.     No current facility-administered medications for this visit.    Allergies:   Latex, Erythromycin, Sulfa antibiotics, Adhesive [tape], and Codeine    Social History:  The patient  reports that she has never smoked. She has never used smokeless tobacco. She reports that she does not drink alcohol and does not use drugs.   Family History:  The patient's family history includes Asthma in her mother; High blood pressure in her mother; Lung cancer in her father.    ROS: All other systems are reviewed and negative. Unless otherwise mentioned in H&P    PHYSICAL EXAM: VS:  BP (!) 134/92   Pulse 94   Ht 5\' 4"  (1.626 m)   Wt 236 lb 12.8 oz (107.4 kg)   SpO2 99%   BMI 40.65 kg/m  , BMI Body mass index is 40.65 kg/m. GEN: Well nourished, well developed, in no acute distress HEENT: normal Neck: no JVD, carotid bruits, or masses Cardiac:RRR; no murmurs, rubs, or gallops,no edema  Respiratory:  Clear to auscultation bilaterally, normal work of breathing GI: soft, nontender, nondistended, + BS MS: no deformity or atrophy Skin: warm and dry, no rash Neuro:  Strength and sensation are intact Psych: euthymic mood, full affect   EKG: Not completed this office visit  Recent Labs: 02/24/2020: BUN <5; Creatinine, Ser 0.68; Hemoglobin 11.9; Platelets 341; Potassium 3.8; Sodium 140    Lipid Panel    Component  Value Date/Time   CHOL 251 (H) 10/03/2009 2130   TRIG 139 10/03/2009 2130   HDL 43 10/03/2009 2130   CHOLHDL 5.8 Ratio 10/03/2009 2130   VLDL 28 10/03/2009 2130   LDLCALC 180 (H) 10/03/2009 2130      Wt Readings from Last 3 Encounters:  03/01/20 236 lb 12.8 oz (107.4 kg)  02/24/20 238 lb (108 kg)  12/12/17 215 lb (97.5 kg)      Other studies Reviewed:  Echo 02/23/20 IMPRESSIONS    1. Left ventricular ejection fraction, by estimation, is 65 to 70%. The  left ventricle has normal function. The left ventricle has no regional  wall motion abnormalities. There is mild left ventricular hypertrophy.  Left ventricular diastolic parameters  are indeterminate.  2. Right ventricular systolic function is normal. The right ventricular  size is moderately enlarged.  3. The mitral valve is normal in structure. No evidence of mitral valve  regurgitation. No evidence of mitral stenosis.  4. The aortic valve is normal in structure. Aortic valve regurgitation is  not visualized. No aortic stenosis is present.  5. The inferior vena cava is normal in size with greater than 50%  respiratory variability, suggesting right atrial pressure of 3 mmHg.   FINDINGS  Left Ventricle: Left ventricular ejection fraction, by estimation, is 65  to 70%. The left ventricle has normal function. The left ventricle has no  regional wall motion abnormalities. The left ventricular internal cavity  size was normal in size. There is  mild left ventricular hypertrophy. Left ventricular diastolic parameters  are indeterminate.   Right Ventricle: The right ventricular size is moderately enlarged. No  increase in right ventricular wall thickness. Right ventricular systolic  function is normal.   Left Atrium: Left atrial size was normal in size.   Right Atrium: Right atrial size was normal in size.   Pericardium: There is no evidence of pericardial effusion.   Mitral Valve: The mitral valve is normal in  structure. No evidence of  mitral valve regurgitation. No evidence of mitral valve stenosis.   Tricuspid Valve: The tricuspid valve is normal in structure. Tricuspid  valve regurgitation is not demonstrated. No evidence of tricuspid  stenosis.   Aortic Valve: The aortic valve is normal in structure. Aortic valve  regurgitation is not visualized. No aortic stenosis is present.   Pulmonic Valve: The pulmonic valve was normal in structure. Pulmonic valve  regurgitation is not visualized. No evidence of pulmonic stenosis.   Aorta: The aortic root is normal in size and structure.   Venous: The inferior vena cava is normal in size with greater than 50%  respiratory variability, suggesting right atrial pressure of 3 mmHg.   IAS/Shunts: No atrial level shunt detected by color flow Doppler.  _____________   Cardiac cath 02/23/20   Dist LAD lesion is 95% stenosed.  The left ventricular systolic function is normal.  LV end diastolic pressure is normal.  The left ventricular ejection fraction is 55-65% by visual estimate.  No evidence for significant coronary atherosclerotic disease but there is a suggestion of very distal apical LAD pruning raising concern for vasospasm versus potential distal SCAD. Normal LAD and dominant RCA vessels.  Normal LV function with EF estimated at 55 to 60% and LVEDP 18 mm.  RECOMMENDATION: We will review with colleagues. High-sensitivity troponin enzymes had increased to 2377 prior to the procedure. This most likely is due to the apical LAD vessel pruning which may be contributed by vasospasm versus SCAD. The patient had significant spasm of her radial and brachial arteries during the procedure which did not respond to increasing doses of verapamil and intra-arterial NTG necessitating transition from the radial to the femoral approach. Will obtain 2D echo Doppler study. With positive troponins we will initiate clopidogril/aspirin. We will also initiate  verapamil with the patient's history of SVT which will be helpful both for blood pressure as well as potential coronary vasospasm.    ASSESSMENT AND PLAN:  1.  Non-ST elevation MI: She did not have any coronary artery disease but was found to have a distal LAD spasm with pruning noted.  She has been placed on verapamil and given nitroglycerin sublingual as needed.  She has been started on clopidogrel 75 mg daily and  aspirin.  She is very anxious about the possibility of having another episode especially since she is having some "twinges" of chest discomfort which frightens her and causes her to believe that is going to be worse.  I have gone over her cardiac catheterization report answered multiple questions from her and her daughter concerning the cath, I have given her a copy of the illustration of her cardiac catheterization procedure, and an explanation of all of her medications.  She appeared to understand and did verbalize that she feels much better having had these explanations.  2.  GERD symptoms: She has talked a lot about heartburn feeling and fullness in her chest after eating.  I have suggested that she take over-the-counter PPI and follow-up with her primary care with possible need for referral to GI if clinically warranted.  3.  Hypertension: Blood pressure is excellently controlled.  She will continue lisinopril and verapamil as directed.  4.  Hyperlipidemia: Continue atorvastatin.  She will need follow-up labs.  She is planning on seeing her PCP at which time labs have been planned.  Therefore I will not order any labs today.  5.  Anxiety: Significant amount related to the events leading to hospitalization and non-STEMI.  She continues to have issues with this at home.  May need to consider psychotherapy or antianxiety medications.  I will defer this decision to her primary care physician to make appropriate referrals or medication prescriptions at their discretion.   Current  medicines are reviewed at length with the patient today.  I have spent 30 mins dedicated to the care of this patient on the date of this encounter to include pre-visit review of records, assessment, management and diagnostic testing,with shared decision making.  Labs/ tests ordered today include: None  Bettey Mare. Liborio Nixon, ANP, AACC   03/01/2020 2:59 PM    [facility] [address] Office [phone] Fax [phone]  Notice: This dictation was prepared with Dragon dictation along with smaller phrase technology. Any transcriptional errors that result from this process are unintentional and may not be corrected upon review.

## 2020-03-01 ENCOUNTER — Other Ambulatory Visit: Payer: Self-pay

## 2020-03-01 ENCOUNTER — Encounter: Payer: Self-pay | Admitting: Adult Health

## 2020-03-01 ENCOUNTER — Ambulatory Visit (INDEPENDENT_AMBULATORY_CARE_PROVIDER_SITE_OTHER): Payer: Managed Care, Other (non HMO) | Admitting: Adult Health

## 2020-03-01 VITALS — BP 134/92 | HR 94 | Ht 64.0 in | Wt 236.8 lb

## 2020-03-01 DIAGNOSIS — Z9889 Other specified postprocedural states: Secondary | ICD-10-CM

## 2020-03-01 DIAGNOSIS — I214 Non-ST elevation (NSTEMI) myocardial infarction: Secondary | ICD-10-CM

## 2020-03-01 DIAGNOSIS — I10 Essential (primary) hypertension: Secondary | ICD-10-CM | POA: Diagnosis not present

## 2020-03-01 DIAGNOSIS — E7800 Pure hypercholesterolemia, unspecified: Secondary | ICD-10-CM

## 2020-03-01 DIAGNOSIS — F418 Other specified anxiety disorders: Secondary | ICD-10-CM

## 2020-03-01 NOTE — Patient Instructions (Signed)
Medication Instructions:  Continue current medications   *If you need a refill on your cardiac medications before your next appointment, please call your pharmacy*   Lab Work: None Ordered   Testing/Procedures: None ordered   Follow-Up: At [facility], you and your health needs are our priority.  As part of our continuing mission to provide you with exceptional heart care, we have created designated Provider Care Teams.  These Care Teams include your primary Cardiologist (physician) and Advanced Practice Providers (APPs -  Physician Assistants and Nurse Practitioners) who all work together to provide you with the care you need, when you need it.  We recommend signing up for the patient portal called "MyChart".  Sign up information is provided on this After Visit Summary.  MyChart is used to connect with patients for Virtual Visits (Telemedicine).  Patients are able to view lab/test results, encounter notes, upcoming appointments, etc.  Non-urgent messages can be sent to your provider as well.   To learn more about what you can do with MyChart, go to [email or web address].    Your next appointment:   6 month(s)  The format for your next appointment:   In Person  Provider:   You may see Nicki Guadalajara, MD  or one of the following Advanced Practice Providers on your designated Care Team:    Azalee Course, PA-C  Micah Flesher, PA-C or   Judy Pimple, [place]

## 2020-03-04 ENCOUNTER — Telehealth: Payer: Self-pay | Admitting: Adult Health

## 2020-03-04 NOTE — Telephone Encounter (Signed)
New Message:     Pt called and said her employer have extended her leave at work indefinitely. Sh e wants to know will she need to be seen again to get a note to return to work when it comes for her to go back to work?

## 2020-03-04 NOTE — Telephone Encounter (Signed)
Returned call to patient who states that she was originally told that she could return call to work this Tuesday. Patient states her leave of absence was approved due to her job being stressful and now her job will allow her to be out longer. Patient states that she wanted to check with Joni Reining, DNP and see if she would approve for patient to be out for the full leave of absence instead of returning to work sooner. Patient wanted to see if Joni Reining, DNP would be able to give her a letter when she is ready to return. Advised patient I would forward message to Joni Reining DNP. for review and advice. Patient verbalized understanding.

## 2020-03-06 NOTE — Telephone Encounter (Signed)
I will address this on Friday when I return.  She will need to give me a return to work date   [employer]

## 2020-03-12 ENCOUNTER — Telehealth: Payer: Self-pay | Admitting: Adult Health

## 2020-03-12 NOTE — Telephone Encounter (Signed)
Called and spoke with pt she states that she is walking out of the UC. She went there because she was having pain in her ear and the lymph node right below. She states that she thought that she might have an ear infection, she "gets these frequently" she states that the MD at the UC said that he was not worried about that just her BP.She states that her BP was 140/80 and states that the MD there said that he was concerned, her "bp was too high and should call her cardiologist. Informed pt that she could try warm compress(warm washcloth for 20 min then off for 20 minutes) to her neckthe area below her ear  Pt informed to call her PCP and discuss this. Verbalizes understanding. She will call back if BP continues to be 140 or >.

## 2020-03-12 NOTE — Telephone Encounter (Signed)
New message:    Patient calling stating that she went the Urgent Care because her left ear and left side of her neck is hurting her. Patient states she need to speak with a nurse.

## 2020-03-19 NOTE — Telephone Encounter (Signed)
Spoke with patient who states that she recently returned to work and has been doing very well since. Patient no longer needs letter. Advised patient to call back to office with any issues, questions, or concerns. Advised patient I would forward message to Joni Reining, DNP to make her aware. Patient verbalized understanding.

## 2020-08-22 ENCOUNTER — Other Ambulatory Visit: Payer: Self-pay | Admitting: Cardiology

## 2020-08-26 ENCOUNTER — Ambulatory Visit: Payer: 59 | Admitting: Cardiovascular Disease

## 2020-09-20 ENCOUNTER — Other Ambulatory Visit: Payer: Self-pay | Admitting: Cardiology

## 2020-10-06 ENCOUNTER — Emergency Department (HOSPITAL_BASED_OUTPATIENT_CLINIC_OR_DEPARTMENT_OTHER): Payer: BC Managed Care – PPO

## 2020-10-06 ENCOUNTER — Other Ambulatory Visit: Payer: Self-pay

## 2020-10-06 ENCOUNTER — Encounter (HOSPITAL_BASED_OUTPATIENT_CLINIC_OR_DEPARTMENT_OTHER): Payer: Self-pay | Admitting: Emergency Medicine

## 2020-10-06 ENCOUNTER — Emergency Department (HOSPITAL_BASED_OUTPATIENT_CLINIC_OR_DEPARTMENT_OTHER)
Admission: EM | Admit: 2020-10-06 | Discharge: 2020-10-06 | Disposition: A | Discharge status: Home / Self Care | Payer: BC Managed Care – PPO | Attending: Emergency Medicine | Admitting: Emergency Medicine

## 2020-10-06 DIAGNOSIS — I2510 Atherosclerotic heart disease of native coronary artery without angina pectoris: Secondary | ICD-10-CM | POA: Diagnosis not present

## 2020-10-06 DIAGNOSIS — Z91040 Latex allergy status: Secondary | ICD-10-CM | POA: Diagnosis not present

## 2020-10-06 DIAGNOSIS — W010XXA Fall on same level from slipping, tripping and stumbling without subsequent striking against object, initial encounter: Secondary | ICD-10-CM | POA: Insufficient documentation

## 2020-10-06 DIAGNOSIS — Z79899 Other long term (current) drug therapy: Secondary | ICD-10-CM | POA: Insufficient documentation

## 2020-10-06 DIAGNOSIS — Z7982 Long term (current) use of aspirin: Secondary | ICD-10-CM | POA: Insufficient documentation

## 2020-10-06 DIAGNOSIS — I10 Essential (primary) hypertension: Secondary | ICD-10-CM | POA: Insufficient documentation

## 2020-10-06 DIAGNOSIS — M25561 Pain in right knee: Secondary | ICD-10-CM | POA: Diagnosis not present

## 2020-10-06 DIAGNOSIS — Y92002 Bathroom of unspecified non-institutional (private) residence single-family (private) house as the place of occurrence of the external cause: Secondary | ICD-10-CM | POA: Insufficient documentation

## 2020-10-06 MED ORDER — OXYCODONE-ACETAMINOPHEN 5-325 MG PO TABS
1.0000 | ORAL_TABLET | Freq: Four times a day (QID) | ORAL | 0 refills | Status: DC | PRN
Start: 2020-10-06 — End: 2020-10-24

## 2020-10-06 NOTE — ED Provider Notes (Signed)
[facility] EMERGENCY DEPARTMENT Provider Note   CSN: [id number] Arrival date & time: 10/06/20  1158     History Chief Complaint  Patient presents with   Knee Pain    Cassidy Sanchez is a 50 y.o. female.  The history is provided by the patient and medical records. No language interpreter was used.  Knee Pain Location:  Knee Time since incident:  3 days Injury: yes   Mechanism of injury: fall   Fall:    Fall occurred:  In the bathroom Knee location:  R knee Pain details:    Quality:  Sharp and aching   Radiates to:  Does not radiate   Severity:  Severe   Onset quality:  Sudden   Progression:  Unchanged Chronicity:  New Tetanus status:  Unknown Prior injury to area:  No Relieved by:  Nothing Worsened by:  Extension and flexion Ineffective treatments:  None tried Associated symptoms: no back pain, no fatigue, no fever, no muscle weakness, no neck pain, no numbness, no swelling and no tingling       Past Medical History:  Diagnosis Date   Family history of anesthesia complication    mom nausea and vomited;confusion   Headache(784.0)    sinus   History of shingles    Hyperlipidemia    Hypertension    Joint swelling    right ankle   Leukocytosis 02/24/2020   PONV (postoperative nausea and vomiting)    S/P cardiac cath 02/23/20 with non obstructive disease but suggestion of vasopsasm vs potential distal SCAD 02/24/2020   Seasonal allergies    takes Zyrtec D as needed   SVT (supraventricular tachycardia) (HCC)    takes Lopressor daily    Patient Active Problem List   Diagnosis Date Noted   CAD in native artery 02/24/2020   S/P cardiac cath 02/23/20 with non obstructive disease but suggestion of vasopsasm vs potential distal SCAD 02/24/2020   Leukocytosis 02/24/2020   ACS (acute coronary syndrome) (HCC) 02/23/2020   NSTEMI (non-ST elevated myocardial infarction) (HCC) 02/23/2020   HYPERLIPIDEMIA 10/04/2009   HERPES ZOSTER 10/03/2009   GLUCOSE  INTOLERANCE 10/03/2009   HYPOKALEMIA 10/03/2009   ANEMIA 10/03/2009   Essential hypertension 10/03/2009   UTI 10/03/2009   DIZZINESS 10/03/2009   SLEEP APNEA 10/03/2009   Paroxysmal SVT (supraventricular tachycardia) (HCC) 10/03/2009    Past Surgical History:  Procedure Laterality Date   ANKLE SURGERY Right    CESAREAN SECTION     CHOLECYSTECTOMY N/A 04/13/2013   Procedure: LAPAROSCOPIC CHOLECYSTECTOMY;  Surgeon: Atilano Ina, MD;  Location: [facility] OR;  Service: General;  Laterality: N/A;   CHOLECYSTECTOMY     LEFT HEART CATH AND CORONARY ANGIOGRAPHY N/A 02/23/2020   Procedure: LEFT HEART CATH AND CORONARY ANGIOGRAPHY;  Surgeon: Lennette Bihari, MD;  Location: [facility] INVASIVE CV LAB;  Service: Cardiovascular;  Laterality: N/A;   NASAL SEPTUM SURGERY     TONSILLECTOMY     TUBAL LIGATION     uvula removed     VAGINAL HYSTERECTOMY       OB History     Gravida  2   Para  2   Term      Preterm      AB      Living  2      SAB      IAB      Ectopic      Multiple      Live Births  Family History  Problem Relation Age of Onset   High blood pressure Mother    Asthma Mother    Lung cancer Father        died in 25s    Social History   Tobacco Use   Smoking status: Never   Smokeless tobacco: Never  Substance Use Topics   Alcohol use: No   Drug use: No    Home Medications Prior to Admission medications   Medication Sig Start Date End Date Taking? Authorizing Provider  lisinopril (ZESTRIL) 10 MG tablet TAKE 1 TABLET(10 MG) BY MOUTH DAILY 08/22/20   Lennette Bihari, MD  acetaminophen (TYLENOL) 500 MG tablet Take 1,000 mg by mouth every 6 (six) hours as needed for headache or mild pain.    [provider]  amitriptyline (ELAVIL) 10 MG tablet Take 10 mg by mouth at bedtime as needed for sleep.    [provider]  aspirin 81 MG chewable tablet Chew 1 tablet (81 mg total) by mouth daily. 02/25/20   Leone Brand, NP  atorvastatin  (LIPITOR) 80 MG tablet TAKE 1 TABLET(80 MG) BY MOUTH AT BEDTIME 09/20/20   Leone Brand, NP  buPROPion (WELLBUTRIN XL) 150 MG 24 hr tablet Take 150 mg by mouth daily.    [provider]  clopidogrel (PLAVIX) 75 MG tablet Take 1 tablet (75 mg total) by mouth daily with breakfast. 02/25/20   Leone Brand, NP  nitroGLYCERIN (NITROSTAT) 0.4 MG SL tablet Place 1 tablet (0.4 mg total) under the tongue every 5 (five) minutes as needed for chest pain. 02/24/20   Leone Brand, NP  valACYclovir (VALTREX) 500 MG tablet Take 500 mg by mouth 3 (three) times daily as needed (flare-up). 09/13/14   [provider]  verapamil (CALAN-SR) 240 MG CR tablet TAKE 1 TABLET(240 MG) BY MOUTH DAILY 09/20/20   Leone Brand, NP  Vitamin D, Ergocalciferol, (DRISDOL) 1.25 MG (50000 UNIT) CAPS capsule Take 50,000 Units by mouth every Sunday. 10/04/19   [provider]    Allergies    Latex, Erythromycin, Sulfa antibiotics, Adhesive [tape], and Codeine  Review of Systems   Review of Systems  Constitutional:  Negative for chills, fatigue and fever.  HENT:  Negative for congestion.   Respiratory:  Negative for cough, chest tightness, shortness of breath and wheezing.   Cardiovascular:  Negative for chest pain.  Gastrointestinal:  Negative for abdominal pain, constipation, diarrhea and nausea.  Genitourinary:  Negative for dysuria.  Musculoskeletal:  Negative for back pain and neck pain.  Skin:  Negative for rash and wound.  Psychiatric/Behavioral:  Negative for agitation.   All other systems reviewed and are negative.  Physical Exam Updated Vital Signs BP 125/77   Pulse 78   Temp 98.7 F (37.1 C) (Oral)   Resp 18   Ht 5' 4.5" (1.638 m)   Wt 104.3 kg   SpO2 99%   BMI 38.87 kg/m   Physical Exam Vitals and nursing note reviewed.  Constitutional:      General: She is not in acute distress.    Appearance: She is well-developed. She is not ill-appearing, toxic-appearing or  diaphoretic.  HENT:     Head: Normocephalic and atraumatic.     Mouth/Throat:     Mouth: Mucous membranes are moist.  Eyes:     Conjunctiva/sclera: Conjunctivae normal.  Cardiovascular:     Rate and Rhythm: Normal rate and regular rhythm.     Heart sounds: No murmur heard. Pulmonary:  Effort: Pulmonary effort is normal. No respiratory distress.     Breath sounds: Normal breath sounds. No wheezing, rhonchi or rales.  Chest:     Chest wall: No tenderness.  Abdominal:     General: Abdomen is flat.     Palpations: Abdomen is soft.     Tenderness: There is no abdominal tenderness. There is no guarding or rebound.  Musculoskeletal:        General: Tenderness and signs of injury present.     Cervical back: Neck supple.     Right knee: No deformity, erythema or lacerations. Tenderness present.       Legs:     Comments: Intact sensation, strength, and pulses distally.  No tenderness of the hip.  Tenderness in the medial right knee with some medial swelling.  No tenderness of the patella or lateral joint line.  No popliteal fossa tenderness.  No calf tenderness.  Lungs clear and exam otherwise unremarkable  Skin:    General: Skin is warm and dry.     Capillary Refill: Capillary refill takes less than 2 seconds.     Findings: No erythema.  Neurological:     General: No focal deficit present.     Mental Status: She is alert.  Psychiatric:        Mood and Affect: Mood normal.    ED Results / Procedures / Treatments   Labs (all labs ordered are listed, but only abnormal results are displayed) Labs Reviewed - No data to display  EKG None  Radiology DG Knee Complete 4 Views Right  Result Date: 10/06/2020 CLINICAL DATA:  Injury.  Slipped in shower on Friday. EXAM: RIGHT KNEE - COMPLETE 4+ VIEW COMPARISON:  02/15/2020 FINDINGS: There is minimal degenerative change in the patellofemoral compartment. No acute fracture, subluxation, or joint effusion. IMPRESSION: No evidence for acute   abnormality. Electronically Signed   By: Norva Pavlov M.D.   On: 10/06/2020 13:01    Procedures Procedures   Medications Ordered in ED Medications - No data to display  ED Course  I have reviewed the triage vital signs and the nursing notes.  Pertinent labs & imaging results that were available during my care of the patient were reviewed by me and considered in my medical decision making (see chart for details).    MDM Rules/Calculators/A&P                           Cassidy Sanchez is a 50 y.o. female with a past medical history significant for hyperlipidemia, hypertension, CAD, and previous SVT who presents with right knee injury.  According to patient, 3 days ago, she was standing in the shower when her knee buckled and her low distal leg went laterally causing pain in her right knee.  She thought she saw her patella dislocate before it popped back.  She reports immediate onset of pain and some swelling in the medial right knee.  She reports is still able to walk but it hurts.  She reports the pain gets up to 10 out of 10 at times.  She reports that years ago she had a knee injury but never had to have any surgery.  She otherwise denies any pain in her ankle, hip, and did not fall or hit her head.  Denies other preceding complaints.  On exam, lungs clear and chest nontender.  Hip nontender.  Ankle nontender.  Knee is tender in the medial area but otherwise  patella was nontender and she does not have any lateral tenderness.  Intact sensation, strength, and pulses distally.  No laceration or bruising seen.  Exam otherwise unremarkable.  X-rays were obtained show no evidence of fracture or dislocation.  No subluxation seen.  Suspect she does have a tendinous or ligamentous injury so we will give her prescription for pain medicine and place her in knee immobilizer and crutches.  She will follow-up with her orthopedist and understands return precautions.  She no other questions or concerns  and was discharged in good condition.   Final Clinical Impression(s) / ED Diagnoses Final diagnoses:  Acute pain of right knee    Rx / DC Orders ED Discharge Orders          Ordered    oxyCODONE-acetaminophen (PERCOCET/ROXICET) 5-325 MG tablet  Every 6 hours PRN        10/06/20 1408            Clinical Impression: 1. Acute pain of right knee     Disposition: Discharge  Condition: Good  I have discussed the results, Dx and Tx plan with the pt(& family if present). He/she/they expressed understanding and agree(s) with the plan. Discharge instructions discussed at great length. Strict return precautions discussed and pt &/or family have verbalized understanding of the instructions. No further questions at time of discharge.    New Prescriptions   OXYCODONE-ACETAMINOPHEN (PERCOCET/ROXICET) 5-325 MG TABLET    Take 1 tablet by mouth every 6 (six) hours as needed for severe pain.    Follow Up: Your orthopedics team     [facility] [id number] [address] [place] [address] [phone]        Kymora Sciara, Canary Brim, MD 10/06/20 1410

## 2020-10-06 NOTE — Discharge Instructions (Signed)
Your history, exam, work-up today are consistent with a likely ligamentous or soft tissue injury to the right knee.  The bony imaging did not show evidence of acute fracture but did show some slight degenerative changes or arthritis as we discussed.  Please use the knee immobilizer and crutches to help minimize discomfort in the leg and use the pain medicine when you have breakthrough discomfort.  Please follow-up with your orthopedist for further evaluation and management as I suspect he may need further advanced imaging if symptoms not improved.  Please rest and stay hydrated.  If any symptoms change or worsen acutely, please return to the nearest emergency department.

## 2020-10-06 NOTE — ED Notes (Signed)
Pharmacy and medications updated with patient 

## 2020-10-06 NOTE — ED Triage Notes (Signed)
Pt reports pain to RT knee s/p slipping in shower on Fri; ambulatory

## 2020-10-23 ENCOUNTER — Ambulatory Visit: Payer: BC Managed Care – PPO | Admitting: Orthopedic Surgery

## 2020-10-23 ENCOUNTER — Other Ambulatory Visit: Payer: Self-pay

## 2020-10-23 DIAGNOSIS — M25561 Pain in right knee: Secondary | ICD-10-CM | POA: Diagnosis not present

## 2020-10-23 DIAGNOSIS — S8991XA Unspecified injury of right lower leg, initial encounter: Secondary | ICD-10-CM

## 2020-10-24 ENCOUNTER — Encounter: Payer: Self-pay | Admitting: Orthopedic Surgery

## 2020-10-24 ENCOUNTER — Other Ambulatory Visit: Payer: Self-pay | Admitting: Surgical

## 2020-10-24 MED ORDER — HYDROCODONE-ACETAMINOPHEN 5-325 MG PO TABS
1.0000 | ORAL_TABLET | Freq: Two times a day (BID) | ORAL | 0 refills | Status: DC | PRN
Start: 2020-10-24 — End: 2020-12-05

## 2020-10-24 NOTE — Telephone Encounter (Signed)
Sent in one-time RX for [employer]

## 2020-10-27 ENCOUNTER — Encounter: Payer: Self-pay | Admitting: Orthopedic Surgery

## 2020-10-27 NOTE — Progress Notes (Signed)
Office Visit Note   Patient: Cassidy Sanchez           Date of Birth: July 21, 1970           MRN: [id number] Visit Date: 10/23/2020 Requested by: Piedad Climes, [place], PA-C [address] [place],  [place] [address] PCP: Piedad Climes, [place], [place]  Subjective: Chief Complaint  Patient presents with   Right Knee - Pain    HPI: Cassidy Sanchez is a 50 y.o. female who presents to the office complaining of right knee pain.  Patient states that she pivoted in the shower and fell, hearing a loud pop in her right knee.  Date of injury was 10/04/2020 she felt a pop on the medial side of the knee and she noticed instant swelling without bruising at the time.  She states she "thought my kneecap was stuck outside".  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  She has no history of prior right knee surgery.  Denies any mechanical locking symptoms.  She has painful weightbearing that she localizes to the medial aspect of the knee.  Increased pain at night.  She went to the emergency department on 10/06/2020 with radiographs demonstrating no acute change.  She also has some posterior pain but most of her pain is in the medial aspect of the knee.  She has been elevating and using ice.  She does have history of MI for which she takes Plavix..                ROS: All systems reviewed are negative as they relate to the chief complaint within the history of present illness.  Patient denies fevers or chills.  Assessment & Plan: Visit Diagnoses:  1. Right knee pain, unspecified chronicity   2. Right knee injury, initial encounter     Plan: Patient is a 50 year old female who presents following right knee injury in the shower about 3 weeks ago.  Radiographs from ER visit are unremarkable.  She mostly has medial sided pain with subtle laxity of the MCL and lacking of hyperextension that is present on the left side.  No patellar apprehension on exam today but  potentially could be MPFL injury with her history of 1 prior patellar dislocation.  She has localized swelling to the femoral attachment of the MCL.  Plan to order MRI of the right knee for further evaluation of right knee MCL tear versus MPFL injury versus meniscal injury.  Follow-up after MRI to review results.  Follow-Up Instructions: No follow-ups on file.   Orders:  Orders Placed This Encounter  Procedures   MR Knee Right w/o contrast   No orders of the defined types were placed in this encounter.     Procedures: No procedures performed   Clinical Data: No additional findings.  Objective: Vital Signs: There were no vitals taken for this visit.  Physical Exam:  Constitutional: Patient appears well-developed HEENT:  Head: Normocephalic Eyes:EOM are normal Neck: Normal range of motion Cardiovascular: Normal rate Pulmonary/chest: Effort normal Neurologic: Patient is alert Skin: Skin is warm Psychiatric: Patient has normal mood and affect  Ortho Exam: Ortho exam demonstrates right knee with 0 degrees extension and 120 degrees of knee flexion.  Lacks hyperextension of the right knee that is present on the left side.  No pain with hip range of motion.  Negative straight leg raise.  No tenderness over the medial joint line or lateral joint line.  No tenderness over the quad tendon, patellar tendon.  Mild tenderness over the medial aspect of the patella there is moderate tenderness over the femoral attachment of the MCL.  Very subtle increased laxity of the MCL of the right knee compared with the left knee but overall feels stable.  No laxity to LCL.  Negative anterior posterior drawer.  Negative Lachman exam.  No patellar apprehension.  No increased laxity of the MPFL versus the contralateral side.    Specialty Comments:  No specialty comments available.  Imaging: No results found.   PMFS History: Patient Active Problem List   Diagnosis Date Noted   CAD in native artery  02/24/2020   S/P cardiac cath 02/23/20 with non obstructive disease but suggestion of vasopsasm vs potential distal SCAD 02/24/2020   Leukocytosis 02/24/2020   ACS (acute coronary syndrome) (HCC) 02/23/2020   NSTEMI (non-ST elevated myocardial infarction) (HCC) 02/23/2020   HYPERLIPIDEMIA 10/04/2009   HERPES ZOSTER 10/03/2009   GLUCOSE INTOLERANCE 10/03/2009   HYPOKALEMIA 10/03/2009   ANEMIA 10/03/2009   Essential hypertension 10/03/2009   UTI 10/03/2009   DIZZINESS 10/03/2009   SLEEP APNEA 10/03/2009   Paroxysmal SVT (supraventricular tachycardia) (HCC) 10/03/2009   Past Medical History:  Diagnosis Date   Family history of anesthesia complication    mom nausea and vomited;confusion   Headache(784.0)    sinus   History of shingles    Hyperlipidemia    Hypertension    Joint swelling    right ankle   Leukocytosis 02/24/2020   PONV (postoperative nausea and vomiting)    S/P cardiac cath 02/23/20 with non obstructive disease but suggestion of vasopsasm vs potential distal SCAD 02/24/2020   Seasonal allergies    takes Zyrtec D as needed   SVT (supraventricular tachycardia) (HCC)    takes Lopressor daily    Family History  Problem Relation Age of Onset   High blood pressure Mother    Asthma Mother    Lung cancer Father        died in 63s    Past Surgical History:  Procedure Laterality Date   ANKLE SURGERY Right    CESAREAN SECTION     CHOLECYSTECTOMY N/A 04/13/2013   Procedure: LAPAROSCOPIC CHOLECYSTECTOMY;  Surgeon: Atilano Ina, MD;  [facility] OR;  Service: General;  Laterality: N/A;   CHOLECYSTECTOMY     LEFT HEART CATH AND CORONARY ANGIOGRAPHY N/A 02/23/2020   Procedure: LEFT HEART CATH AND CORONARY ANGIOGRAPHY;  Surgeon: Lennette Bihari, MD;  Location: [facility] INVASIVE CV LAB;  Service: Cardiovascular;  Laterality: N/A;   NASAL SEPTUM SURGERY     TONSILLECTOMY     TUBAL LIGATION     uvula removed     VAGINAL HYSTERECTOMY     Social History   Occupational History    Not on file  Tobacco Use   Smoking status: Never   Smokeless tobacco: Never  Substance and Sexual Activity   Alcohol use: No   Drug use: No   Sexual activity: Not on file

## 2020-10-29 ENCOUNTER — Encounter: Payer: Self-pay | Admitting: Orthopedic Surgery

## 2020-11-02 ENCOUNTER — Ambulatory Visit (HOSPITAL_BASED_OUTPATIENT_CLINIC_OR_DEPARTMENT_OTHER)
Admission: RE | Admit: 2020-11-02 | Discharge: 2020-11-02 | Disposition: A | Discharge status: Home / Self Care | Payer: BC Managed Care – PPO | Source: Ambulatory Visit | Attending: Orthopedic Surgery | Admitting: Orthopedic Surgery

## 2020-11-02 ENCOUNTER — Other Ambulatory Visit: Payer: Self-pay

## 2020-11-02 DIAGNOSIS — M25561 Pain in right knee: Secondary | ICD-10-CM | POA: Insufficient documentation

## 2020-11-18 ENCOUNTER — Ambulatory Visit: Payer: BC Managed Care – PPO | Admitting: Orthopedic Surgery

## 2020-11-18 ENCOUNTER — Other Ambulatory Visit: Payer: Self-pay

## 2020-11-18 DIAGNOSIS — S83241D Other tear of medial meniscus, current injury, right knee, subsequent encounter: Secondary | ICD-10-CM | POA: Diagnosis not present

## 2020-11-19 ENCOUNTER — Telehealth: Payer: Self-pay

## 2020-11-19 NOTE — Telephone Encounter (Signed)
   Name: Cassidy Sanchez  DOB: May 18, 1970  MRN: [id number]  Primary Cardiologist: Nicki Guadalajara, MD  Chart reviewed as part of pre-operative protocol coverage. Because of Cassidy Sanchez's past medical history and time since last visit, she will require a follow-up visit in order to better assess preoperative cardiovascular risk.  FYI - She does have an appt with Dr. Tresa Endo in Jan 2023.    Pre-op covering staff: - Please schedule appointment and call patient to inform them. If patient already had an upcoming appointment within acceptable timeframe, please add "pre-op clearance" to the appointment notes so provider is aware. - Please contact requesting surgeon's office via preferred method (i.e, phone, fax) to inform them of need for appointment prior to surgery.  If applicable, this message will also be routed to pharmacy pool and/or primary cardiologist for input on holding anticoagulant/antiplatelet agent as requested below so that this information is available to the clearing provider at time of patient's appointment.   Tereso Newcomer, PA-C  11/19/2020, 10:28 AM

## 2020-11-19 NOTE — Telephone Encounter (Signed)
   [facility] Pre-operative Risk Assessment    Patient Name: Cassidy Sanchez  DOB: [birth year] MRN: [id number]    Request for surgical clearance:  What type of surgery is being performed  Right knee arthroscopy  When is this surgery scheduled  TBD  What type of clearance is required   Both  Are there any medications that need to be held prior to surgery and how long  Aspirin and Plavix   Practice name and name of physician performing surgery  [facility]   Dr.Michael Naiping        6.What is the office phone number   [phone]   7.   What is the office fax number  [phone]  8.   Anesthesia type   Choice   Neoma Laming 11/19/2020, 9:59 AM  _________________________________________________________________   (provider comments below)

## 2020-11-19 NOTE — Telephone Encounter (Signed)
Pt has been scheduled to see Bettina Gavia, PA-C, 11/22/2020, and surgical clearance will be addressed at that time.  Pt wanted to keep her appointment in January with Dr. Tresa Endo, so kept that on the books.  Will route back to the requesting surgeon's office to make them aware.

## 2020-11-20 ENCOUNTER — Encounter: Payer: Self-pay | Admitting: Orthopedic Surgery

## 2020-11-20 NOTE — Progress Notes (Signed)
Office Visit Note   Patient: Cassidy Sanchez           Date of Birth: 05-02-1970           MRN: [id number] Visit Date: 11/18/2020 Requested by: Piedad Climes, [place], PA-C [address] [place],  [place] [address] PCP: Piedad Climes, [place], [place]  Subjective: Chief Complaint  Patient presents with   Right Knee - Follow-up, Pain    MRI right knee review    HPI: Cassidy Sanchez is a 50 year old patient with persistent right knee pain.  States that the knee is starting to buckle.  Feels a pinching in the back of her knee and leg.  Tylenol 3 upsets her stomach.  Stiff in the morning and hard to stand.  She works from home.  She states she had a "mild heart attack" in February.  She is on Plavix.  No personal or family history of DVT or pulmonary embolism.  She reports that she did not have a stent placed.              ROS: All systems reviewed are negative as they relate to the chief complaint within the history of present illness.  Patient denies  fevers or chills.   Assessment & Plan: Visit Diagnoses:  1. Acute medial meniscal tear, right, subsequent encounter     Plan: Impression is right knee pain with continued medial and posterior symptoms.  MRI scan is reviewed.  Although no definitive discrete tear is identified there is a change in morphology of that medial meniscus from the typical triangular  shape to more of a pentagon shaped.  I think this could represent an occult tear.  Patient would like to have arthroscopic evaluation just so she knows.  I did discuss with her the risk and benefits of the surgery including but not limited to infection nerve and vessel damage knee stiffness as well as the chance that this may not help her knee especially if that meniscus is normal.  Patient understands the risk and benefits.  She will need cardiac restratification prior to surgery to develop a plan about perioperative Plavix management.  All questions answered.  Follow-Up  Instructions: No follow-ups on file.   Orders:  No orders of the defined types were placed in this encounter.  No orders of the defined types were placed in this encounter.     Procedures: No procedures performed   Clinical Data: No additional findings.  Objective: Vital Signs: There were no vitals taken for this visit.  Physical Exam:   Constitutional: Patient appears well-developed HEENT:  Head: Normocephalic Eyes:EOM are normal Neck: Normal range of motion Cardiovascular: Normal rate Pulmonary/chest: Effort normal Neurologic: Patient is alert Skin: Skin is warm Psychiatric: Patient has normal mood and affect   Ortho Exam: Ortho exam demonstrates full active and passive range of motion of the right knee.  Has medial greater than lateral joint line tenderness.  Symmetric mild patellofemoral crepitus with no effusion in either knee.  No groin pain with internal or external rotation of the right leg.  Pedal pulses palpable.  Collateral and cruciate ligaments stable.  Specialty Comments:  No specialty comments available.  Imaging: No results found.   PMFS History: Patient Active Problem List   Diagnosis Date Noted   CAD in native artery 02/24/2020   S/P cardiac cath 02/23/20 with non obstructive disease but suggestion of vasopsasm vs potential distal SCAD 02/24/2020   Leukocytosis 02/24/2020   ACS (acute coronary syndrome) (  [facility]) 02/23/2020   NSTEMI (non-ST elevated myocardial infarction) ([facility]) 02/23/2020   HYPERLIPIDEMIA 10/04/2009   HERPES ZOSTER 10/03/2009   GLUCOSE INTOLERANCE 10/03/2009   HYPOKALEMIA 10/03/2009   ANEMIA 10/03/2009   Essential hypertension 10/03/2009   UTI 10/03/2009   DIZZINESS 10/03/2009   SLEEP APNEA 10/03/2009   Paroxysmal SVT (supraventricular tachycardia) ([facility]) 10/03/2009   Past Medical History:  Diagnosis Date   Family history of anesthesia complication    mom nausea and vomited;confusion   Headache(784.0)    sinus   History  of shingles    Hyperlipidemia    Hypertension    Joint swelling    right ankle   Leukocytosis 02/24/2020   PONV (postoperative nausea and vomiting)    S/P cardiac cath 02/23/20 with non obstructive disease but suggestion of vasopsasm vs potential distal SCAD 02/24/2020   Seasonal allergies    takes Zyrtec D as needed   SVT (supraventricular tachycardia) ([facility])    takes Lopressor daily    Family History  Problem Relation Age of Onset   High blood pressure Mother    Asthma Mother    Lung cancer Father        died in 80s    Past Surgical History:  Procedure Laterality Date   ANKLE SURGERY Right    CESAREAN SECTION     CHOLECYSTECTOMY N/A 04/13/2013   Procedure: LAPAROSCOPIC CHOLECYSTECTOMY;  Surgeon: Gayland Curry, MD;  Location: [facility];  Service: General;  Laterality: N/A;   CHOLECYSTECTOMY     LEFT HEART CATH AND CORONARY ANGIOGRAPHY N/A 02/23/2020   Procedure: LEFT HEART CATH AND CORONARY ANGIOGRAPHY;  Surgeon: Troy Sine, MD;  Location: [facility];  Service: Cardiovascular;  Laterality: N/A;   NASAL SEPTUM SURGERY     TONSILLECTOMY     TUBAL LIGATION     uvula removed     VAGINAL HYSTERECTOMY     Social History   Occupational History   Not on file  Tobacco Use   Smoking status: Never   Smokeless tobacco: Never  Substance and Sexual Activity   Alcohol use: No   Drug use: No   Sexual activity: Not on file

## 2020-11-22 ENCOUNTER — Ambulatory Visit: Payer: BC Managed Care – PPO | Admitting: Physician Assistant

## 2020-11-22 ENCOUNTER — Other Ambulatory Visit: Payer: Self-pay

## 2020-11-22 ENCOUNTER — Encounter: Payer: Self-pay | Admitting: Physician Assistant

## 2020-11-22 VITALS — BP 142/82 | HR 84 | Ht 65.0 in | Wt 233.4 lb

## 2020-11-22 DIAGNOSIS — Z01818 Encounter for other preprocedural examination: Secondary | ICD-10-CM

## 2020-11-22 DIAGNOSIS — Z79899 Other long term (current) drug therapy: Secondary | ICD-10-CM | POA: Diagnosis not present

## 2020-11-22 DIAGNOSIS — I214 Non-ST elevation (NSTEMI) myocardial infarction: Secondary | ICD-10-CM

## 2020-11-22 DIAGNOSIS — I10 Essential (primary) hypertension: Secondary | ICD-10-CM | POA: Diagnosis not present

## 2020-11-22 DIAGNOSIS — E785 Hyperlipidemia, unspecified: Secondary | ICD-10-CM

## 2020-11-22 DIAGNOSIS — R7303 Prediabetes: Secondary | ICD-10-CM

## 2020-11-22 NOTE — Patient Instructions (Signed)
Medication Instructions:  INCREASE Lisinopril to 15 mg daily   *If you need a refill on your cardiac medications before your next appointment, please call your pharmacy*  Lab Work: Your physician recommends that you return for lab work IN 2 MONTHS:  A1c BMET Fasting Lipid Panel-DO NOT EAT OR DRINK PAST MIDNIGHT. OKAY TO HAVE WATER If you have labs (blood work) drawn today and your tests are completely normal, you will receive your results only by: MyChart Message (if you have MyChart) OR A paper copy in the mail If you have any lab test that is abnormal or we need to change your treatment, we will call you to review the results.  Testing/Procedures: NONE ordered at this time of appointment   Follow-Up: At [facility], you and your health needs are our priority.  As part of our continuing mission to provide you with exceptional heart care, we have created designated Provider Care Teams.  These Care Teams include your primary Cardiologist (physician) and Advanced Practice Providers (APPs -  Physician Assistants and Nurse Practitioners) who all work together to provide you with the care you need, when you need it.  Your next appointment:   6 month(s)  The format for your next appointment:   In Person  Provider:   Nicki Guadalajara, MD    Other Instructions

## 2020-11-22 NOTE — Progress Notes (Addendum)
Cardiology Office Note:    Date:  11/22/2020   ID:  Natale Milch, DOB 01-29-1970, MRN [id number]  PCP:  Burnis Medin, PA-C  Cardiologist:  Nicki Guadalajara, MD   Referring MD: Burnis Medin, *   Chief Complaint  Patient presents with   Pre-op Exam     History of Present Illness:    Cassidy Sanchez is a 50 y.o. female with a hx of hypertension, hyperlipidemia, SVT.  She was admitted 02/22/2020 with chest pain with nausea and emesis.  Initial EKG was nonischemic but repeat EKG showed nonspecific ST abnormalities.  Heart catheterization showed a distal LAD stenosis of 95% and normal LVEF.  There was concern for vasospasm versus potential distal SCAD.  She was placed on aspirin and Plavix x12 months, started on verapamil in the setting of history of SVT and vasospasm.  She was resumed on lisinopril 10 mg daily and simvastatin was switched to 80 mg of atorvastatin.  She was last seen in clinic by Joni Reining, NP on 02/08/2020.  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  She had taken nitro on 2 different occasions.  She was having severe anxiety surrounding the circumstances of her episode.  She presents today for preoperative clearance for right knee arthroscopy.  We have been asked to hold aspirin and Plavix. She walks everyday on a trail for 2 miles without angina. She popped her knee in the shower and now needs surgery for torn meniscus. She refrains from fried foods. She has lost about 20 lbs.    Past Medical History:  Diagnosis Date   Family history of anesthesia complication    mom nausea and vomited;confusion   Headache(784.0)    sinus   History of shingles    Hyperlipidemia    Hypertension    Joint swelling    right ankle   Leukocytosis 02/24/2020   PONV (postoperative nausea and vomiting)    S/P cardiac cath 02/23/20 with non obstructive disease but suggestion of vasopsasm vs potential distal SCAD 02/24/2020    Seasonal allergies    takes Zyrtec D as needed   SVT (supraventricular tachycardia) ([facility])    takes Lopressor daily    Past Surgical History:  Procedure Laterality Date   ANKLE SURGERY Right    CESAREAN SECTION     CHOLECYSTECTOMY N/A 04/13/2013   Procedure: LAPAROSCOPIC CHOLECYSTECTOMY;  Surgeon: Atilano Ina, MD;  Location: [facility];  Service: General;  Laterality: N/A;   CHOLECYSTECTOMY     LEFT HEART CATH AND CORONARY ANGIOGRAPHY N/A 02/23/2020   Procedure: LEFT HEART CATH AND CORONARY ANGIOGRAPHY;  Surgeon: Lennette Bihari, MD;  Location: [facility];  Service: Cardiovascular;  Laterality: N/A;   NASAL SEPTUM SURGERY     TONSILLECTOMY     TUBAL LIGATION     uvula removed     VAGINAL HYSTERECTOMY      Current Medications: Current Meds  Medication Sig   acetaminophen (TYLENOL) 500 MG tablet Take 1,000 mg by mouth every 6 (six) hours as needed for headache or mild pain.   amitriptyline (ELAVIL) 10 MG tablet Take 10 mg by mouth at bedtime as needed for sleep.   aspirin 81 MG chewable tablet Chew 1 tablet (81 mg total) by mouth daily.   atorvastatin (LIPITOR) 80 MG tablet TAKE 1 TABLET(80 MG) BY MOUTH AT BEDTIME   buPROPion (WELLBUTRIN XL) 150 MG 24 hr tablet Take 150 mg by  mouth daily.   clopidogrel (PLAVIX) 75 MG tablet Take 1 tablet (75 mg total) by mouth daily with breakfast.   HYDROcodone-acetaminophen (NORCO/VICODIN) 5-325 MG tablet Take 1 tablet by mouth every 12 (twelve) hours as needed for moderate pain.   lisinopril (ZESTRIL) 10 MG tablet TAKE 1 TABLET(10 MG) BY MOUTH DAILY (Patient taking differently: Take 15 mg by mouth daily.)   nitroGLYCERIN (NITROSTAT) 0.4 MG SL tablet Place 1 tablet (0.4 mg total) under the tongue every 5 (five) minutes as needed for chest pain.   valACYclovir (VALTREX) 500 MG tablet Take 500 mg by mouth 3 (three) times daily as needed (flare-up).   verapamil (CALAN-SR) 240 MG CR tablet TAKE 1 TABLET(240 MG) BY MOUTH DAILY     Allergies:    Latex, Erythromycin, Sulfa antibiotics, Adhesive [tape], and Codeine   Social History   Socioeconomic History   Marital status: Single    Spouse name: Not on file   Number of children: Not on file   Years of education: Not on file   Highest education level: Not on file  Occupational History   Not on file  Tobacco Use   Smoking status: Never   Smokeless tobacco: Never  Substance and Sexual Activity   Alcohol use: No   Drug use: No   Sexual activity: Not on file  Other Topics Concern   Not on file  Social History Narrative   Not on file   Social Determinants of Health   Financial Resource Strain: Not on file  Food Insecurity: Not on file  Transportation Needs: Not on file  Physical Activity: Not on file  Stress: Not on file  Social Connections: Not on file     Family History: The patient's family history includes Asthma in her mother; High blood pressure in her mother; Lung cancer in her father.  ROS:   Please see the history of present illness.     All other systems reviewed and are negative.  EKGs/Labs/Other Studies Reviewed:    The following studies were reviewed today:  Left heart cath 02/23/2020 Dist LAD lesion is 95% stenosed. The left ventricular systolic function is normal. LV end diastolic pressure is normal. The left ventricular ejection fraction is 55-65% by visual estimate.   No evidence for significant coronary atherosclerotic disease but there is a suggestion of very distal apical LAD pruning raising concern for vasospasm versus potential distal SCAD.  Normal LAD and dominant RCA vessels.   Normal LV function with EF estimated at 55 to 60% and LVEDP 18 mm.   RECOMMENDATION: We will review with colleagues.  High-sensitivity troponin enzymes had increased to 2377 prior to the procedure.  This most likely is due to the apical LAD vessel pruning which may be contributed by vasospasm versus SCAD. The patient had significant spasm of her radial and  brachial arteries during the procedure which did not respond to increasing doses of verapamil and intra-arterial NTG necessitating transition from the radial to the femoral approach.  Will obtain 2D echo Doppler study.  With positive troponins we will initiate clopidogril/aspirin.  We will also initiate verapamil with the patient's history of SVT which will be helpful both for blood pressure as well as potential coronary vasospasm.     Echocardiogram 02/23/2020  1. Left ventricular ejection fraction, by estimation, is 65 to 70%. The  left ventricle has normal function. The left ventricle has no regional  wall motion abnormalities. There is mild left ventricular hypertrophy.  Left ventricular diastolic parameters  are  indeterminate.   2. Right ventricular systolic function is normal. The right ventricular  size is moderately enlarged.   3. The mitral valve is normal in structure. No evidence of mitral valve  regurgitation. No evidence of mitral stenosis.   4. The aortic valve is normal in structure. Aortic valve regurgitation is  not visualized. No aortic stenosis is present.   5. The inferior vena cava is normal in size with greater than 50%  respiratory variability, suggesting right atrial pressure of 3 mmHg.     EKG:  EKG is  ordered today.  The ekg ordered today demonstrates sinus rhythm with HR 84  Recent Labs: 02/24/2020: BUN <5; Creatinine, Ser 0.68; Hemoglobin 11.9; Platelets 341; Potassium 3.8; Sodium 140  Recent Lipid Panel    Component Value Date/Time   CHOL 251 (H) 10/03/2009 2130   TRIG 139 10/03/2009 2130   HDL 43 10/03/2009 2130   CHOLHDL 5.8 Ratio 10/03/2009 2130   VLDL 28 10/03/2009 2130   LDLCALC 180 (H) 10/03/2009 2130    Physical Exam:    VS:  BP (!) 142/82   Pulse 84   Ht 5\' 5"  (1.651 m)   Wt 233 lb 6.4 oz (105.9 kg)   SpO2 99%   BMI 38.84 kg/m     Wt Readings from Last 3 Encounters:  11/22/20 233 lb 6.4 oz (105.9 kg)  10/06/20 230 lb (104.3 kg)   03/01/20 236 lb 12.8 oz (107.4 kg)     GEN:  Well nourished, well developed in no acute distress HEENT: Normal NECK: No JVD; No carotid bruits LYMPHATICS: No lymphadenopathy CARDIAC: RRR, no murmurs, rubs, gallops RESPIRATORY:  Clear to auscultation without rales, wheezing or rhonchi  ABDOMEN: Soft, non-tender, non-distended MUSCULOSKELETAL:  No edema; No deformity  SKIN: Warm and dry NEUROLOGIC:  Alert and oriented x 3 PSYCHIATRIC:  Normal affect   ASSESSMENT:    1. Preop examination   2. Non-ST elevation (NSTEMI) myocardial infarction ([facility])   3. Essential hypertension   4. Medication management   5. Hyperlipidemia, unspecified hyperlipidemia type   6. Prediabetes    PLAN:    In order of problems listed above:  NSTEMI No significant CAD, but distal LAD spasm with pruning noted, suspicion for SCAD.  No PCI. ASA and plavix x 12 months from 02/23/2020.   Hypertension BP running in the 140s at home. Is 142 in the office. Will increase lisinopril to 15 mg and monitor BP for 2 weeks. She will present for BMP, lipids, and A1c in 2 weeks. May need to increase to 20 mg, if not at goal. Would prefer her closer to 120 given CAD and pre-DM.    Hyperlipidemia with LDL goal < 70 Continue 80 mg lipitor. Will repeat lipids in 2 weeks.   Prediabetes A1c 6.3% - this was at the time of her NSTEMI. She has started exercising and has drastically changed her diet. Will recheck A1c.    Preoperative evaluation for Mace during the perioperative period She can complete more than 4.0 METS without angina (2 miles walking, stairs multiple times per day). According to the RCRI, she has a 0.9% risk of MACE. I will reach out to Dr. 02/25/2020 to comment on holding plavix and AS within 12 months of her NSTEMI.    Follow up labs, increase lisinopril as needed.   ADDENDUM: Through direct verbal communication with Dr. Tresa Endo, OK to hold ASA and plavix for knee surgery and resume as soon after when safe.  Medication Adjustments/Labs and Tests Ordered: Current medicines are reviewed at length with the patient today.  Concerns regarding medicines are outlined above.  Orders Placed This Encounter  Procedures   Lipid panel   Basic metabolic panel   Hemoglobin A1c   EKG 12-Lead   No orders of the defined types were placed in this encounter.   Signed, Marcelino Duster, PA  11/22/2020 2:40 PM    [facility]

## 2020-12-03 ENCOUNTER — Other Ambulatory Visit: Payer: Self-pay

## 2020-12-03 ENCOUNTER — Encounter (HOSPITAL_COMMUNITY): Payer: Self-pay | Admitting: Orthopedic Surgery

## 2020-12-03 NOTE — Progress Notes (Signed)
PCP - Burnett Kanaris, PA-C Cardiologist - Dr. Tresa Endo EKG - 11/22/20 Chest x-ray -  ECHO -  Cardiac Cath -  CPAP -   Blood Thinner Instructions: per pt, per MD instructed to stop ASA and Plavix 5 days prior to surgery - pt last dose Plavix and ASA 11/25  Aspirin Instructions: see above  ERAS Protcol - n/a - clears until 1300  COVID TEST- n/a  Anesthesia review: n/a  -------------  SDW INSTRUCTIONS:  Your procedure is scheduled on Thursday 12/1. Please report to [facility] Main Entrance "A" at 1345 PM., and check in at the Admitting office. Call this number if you have problems the morning of surgery: [phone]   Remember: Do not eat after midnight the night before your surgery  You may drink clear liquids until 1315 the day of your surgery.   Clear liquids allowed are: Water, Non-Citrus Juices (without pulp), Carbonated Beverages, Clear Tea, Black Coffee Only, and Gatorade   Medications to take morning of surgery with a sip of water include: acetaminophen (TYLENOL)  buPROPion (WELLBUTRIN XL)   nitroGLYCERIN (NITROSTAT) - if needed  As of today, STOP taking any Aspirin (unless otherwise instructed by your surgeon), Aleve, Naproxen, Ibuprofen, Motrin, Advil, Goody's, BC's, all herbal medications, fish oil, and all vitamins.    The Morning of Surgery Do not wear jewelry, make-up or nail polish. Do not wear lotions, powders, or perfumes or deodorant  Do not bring valuables to the hospital. [facility] is not responsible for any belongings or valuables.  If you are a smoker, DO NOT Smoke 24 hours prior to surgery  If you wear a CPAP at night please bring your mask the morning of surgery   Remember that you must have someone to transport you home after your surgery, and remain with you for 24 hours if you are discharged the same day.  Please bring cases for contacts, glasses, hearing aids, dentures or bridgework because it cannot be worn into surgery.   Patients  discharged the day of surgery will not be allowed to drive home.   Please shower the NIGHT BEFORE/MORNING OF SURGERY (use antibacterial soap like DIAL soap if possible). Wear comfortable clothes the morning of surgery. Oral Hygiene is also important to reduce your risk of infection.  Remember - BRUSH YOUR TEETH THE MORNING OF SURGERY WITH YOUR REGULAR TOOTHPASTE  Patient denies shortness of breath, fever, cough and chest pain.

## 2020-12-05 ENCOUNTER — Ambulatory Visit (HOSPITAL_COMMUNITY)
Admission: RE | Admit: 2020-12-05 | Discharge: 2020-12-05 | Disposition: A | Discharge status: Home / Self Care | Payer: BC Managed Care – PPO | Attending: Orthopedic Surgery | Admitting: Orthopedic Surgery

## 2020-12-05 ENCOUNTER — Encounter: Payer: Self-pay | Admitting: Orthopedic Surgery

## 2020-12-05 ENCOUNTER — Encounter (HOSPITAL_COMMUNITY): Payer: Self-pay | Admitting: Orthopedic Surgery

## 2020-12-05 ENCOUNTER — Encounter (HOSPITAL_COMMUNITY)
Admission: RE | Disposition: A | Discharge status: Home / Self Care | Payer: Self-pay | Source: Home / Self Care | Attending: Orthopedic Surgery

## 2020-12-05 ENCOUNTER — Ambulatory Visit (HOSPITAL_COMMUNITY): Payer: BC Managed Care – PPO | Admitting: Anesthesiology

## 2020-12-05 DIAGNOSIS — Z7902 Long term (current) use of antithrombotics/antiplatelets: Secondary | ICD-10-CM | POA: Diagnosis not present

## 2020-12-05 DIAGNOSIS — S83241D Other tear of medial meniscus, current injury, right knee, subsequent encounter: Secondary | ICD-10-CM

## 2020-12-05 DIAGNOSIS — S83241A Other tear of medial meniscus, current injury, right knee, initial encounter: Secondary | ICD-10-CM | POA: Diagnosis present

## 2020-12-05 DIAGNOSIS — X58XXXA Exposure to other specified factors, initial encounter: Secondary | ICD-10-CM | POA: Insufficient documentation

## 2020-12-05 DIAGNOSIS — Z01818 Encounter for other preprocedural examination: Secondary | ICD-10-CM

## 2020-12-05 HISTORY — PX: KNEE ARTHROSCOPY: SHX127

## 2020-12-05 LAB — CBC
HCT: 40.2 % (ref 36.0–46.0)
Hemoglobin: 13.2 g/dL (ref 12.0–15.0)
MCH: 30.1 pg (ref 26.0–34.0)
MCHC: 32.8 g/dL (ref 30.0–36.0)
MCV: 91.6 fL (ref 80.0–100.0)
Platelets: 337 10*3/uL (ref 150–400)
RBC: 4.39 MIL/uL (ref 3.87–5.11)
RDW: 11.8 % (ref 11.5–15.5)
WBC: 7.8 10*3/uL (ref 4.0–10.5)
nRBC: 0 % (ref 0.0–0.2)

## 2020-12-05 LAB — BASIC METABOLIC PANEL
Anion gap: 9 (ref 5–15)
BUN: 7 mg/dL (ref 6–20)
CO2: 27 mmol/L (ref 22–32)
Calcium: 9.5 mg/dL (ref 8.9–10.3)
Chloride: 105 mmol/L (ref 98–111)
Creatinine, Ser: 0.62 mg/dL (ref 0.44–1.00)
GFR, Estimated: >60 mL/min (ref >60)
Glucose, Bld: 103 mg/dL — ABNORMAL HIGH (ref 70–99)
Potassium: 3 mmol/L — ABNORMAL LOW (ref 3.5–5.1)
Sodium: 141 mmol/L (ref 135–145)

## 2020-12-05 SURGERY — ARTHROSCOPY, KNEE
Anesthesia: General | Site: Knee | Laterality: Right

## 2020-12-05 MED ORDER — CHLORHEXIDINE GLUCONATE 0.12 % MT SOLN
15.0000 mL | Freq: Once | OROMUCOSAL | Status: AC
  Administered 2020-12-05: 15 mL via OROMUCOSAL
  Filled 2020-12-05: qty 15

## 2020-12-05 MED ORDER — MEPERIDINE HCL 25 MG/ML IJ SOLN
6.2500 mg | INTRAMUSCULAR | Status: DC | PRN
Start: 2020-12-05 — End: 2020-12-06

## 2020-12-05 MED ORDER — DEXAMETHASONE SODIUM PHOSPHATE 10 MG/ML IJ SOLN
INTRAMUSCULAR | Status: AC
  Filled 2020-12-05: qty 1

## 2020-12-05 MED ORDER — ONDANSETRON HCL 4 MG/2ML IJ SOLN
INTRAMUSCULAR | Status: DC | PRN
  Administered 2020-12-05: 4 mg via INTRAVENOUS

## 2020-12-05 MED ORDER — MORPHINE SULFATE 4 MG/ML IJ SOLN
INTRAMUSCULAR | Status: DC | PRN
  Administered 2020-12-05: 8 mg via INTRAMUSCULAR

## 2020-12-05 MED ORDER — POVIDONE-IODINE 7.5 % EX SOLN
Freq: Once | CUTANEOUS | Status: DC
  Filled 2020-12-05: qty 118

## 2020-12-05 MED ORDER — BUPIVACAINE-EPINEPHRINE (PF) 0.25% -1:200000 IJ SOLN
INTRAMUSCULAR | Status: AC
  Filled 2020-12-05: qty 30

## 2020-12-05 MED ORDER — PHENYLEPHRINE HCL-NACL 20-0.9 MG/250ML-% IV SOLN
INTRAVENOUS | Status: DC | PRN
Start: 2020-12-05 — End: 2020-12-05
  Administered 2020-12-05: 25 ug/min via INTRAVENOUS

## 2020-12-05 MED ORDER — OXYCODONE HCL 5 MG PO TABS: 5.0000 mg | ORAL_TABLET | ORAL | 0 refills | Status: DC | PRN

## 2020-12-05 MED ORDER — LACTATED RINGERS IV SOLN: INTRAVENOUS | Status: DC

## 2020-12-05 MED ORDER — CLONIDINE HCL (ANALGESIA) 100 MCG/ML EP SOLN
EPIDURAL | Status: DC | PRN
  Administered 2020-12-05: 1 mL

## 2020-12-05 MED ORDER — EPINEPHRINE PF 1 MG/ML IJ SOLN
INTRAMUSCULAR | Status: DC | PRN
  Administered 2020-12-05: 1 mg via INTRAMUSCULAR
  Administered 2020-12-05: 1 mg

## 2020-12-05 MED ORDER — SODIUM CHLORIDE 0.9 % IR SOLN
Status: DC | PRN
  Administered 2020-12-05: 6000 mL
  Administered 2020-12-05: 1000 mL

## 2020-12-05 MED ORDER — EPINEPHRINE PF 1 MG/ML IJ SOLN
INTRAMUSCULAR | Status: AC
  Filled 2020-12-05: qty 2

## 2020-12-05 MED ORDER — MORPHINE SULFATE (PF) 4 MG/ML IV SOLN
INTRAVENOUS | Status: AC
  Filled 2020-12-05: qty 2

## 2020-12-05 MED ORDER — BUPIVACAINE HCL (PF) 0.25 % IJ SOLN
INTRAMUSCULAR | Status: AC
  Filled 2020-12-05: qty 30

## 2020-12-05 MED ORDER — CEFAZOLIN SODIUM-DEXTROSE 2-3 GM-%(50ML) IV SOLR
INTRAVENOUS | Status: DC | PRN
  Administered 2020-12-05: 2 g via INTRAVENOUS

## 2020-12-05 MED ORDER — LIDOCAINE 2% (20 MG/ML) 5 ML SYRINGE
INTRAMUSCULAR | Status: DC | PRN
  Administered 2020-12-05: 20 mg via INTRAVENOUS

## 2020-12-05 MED ORDER — HYDROMORPHONE HCL 1 MG/ML IJ SOLN
0.2500 mg | INTRAMUSCULAR | Status: DC | PRN
Start: 2020-12-05 — End: 2020-12-06
  Administered 2020-12-05 (×2): 0.5 mg via INTRAVENOUS

## 2020-12-05 MED ORDER — FENTANYL CITRATE (PF) 250 MCG/5ML IJ SOLN
INTRAMUSCULAR | Status: DC | PRN
  Administered 2020-12-05 (×5): 50 ug via INTRAVENOUS

## 2020-12-05 MED ORDER — FENTANYL CITRATE (PF) 250 MCG/5ML IJ SOLN
INTRAMUSCULAR | Status: AC
  Filled 2020-12-05: qty 5

## 2020-12-05 MED ORDER — DEXMEDETOMIDINE (PRECEDEX) IN NS 20 MCG/5ML (4 MCG/ML) IV SYRINGE
PREFILLED_SYRINGE | INTRAVENOUS | Status: DC | PRN
  Administered 2020-12-05: 8 ug via INTRAVENOUS
  Administered 2020-12-05: 12 ug via INTRAVENOUS

## 2020-12-05 MED ORDER — PROMETHAZINE HCL 25 MG/ML IJ SOLN
6.2500 mg | INTRAMUSCULAR | Status: DC | PRN
Start: 2020-12-05 — End: 2020-12-06

## 2020-12-05 MED ORDER — POVIDONE-IODINE 10 % EX SWAB: 2.0000 "application " | Freq: Once | CUTANEOUS | Status: DC

## 2020-12-05 MED ORDER — ONDANSETRON HCL 4 MG/2ML IJ SOLN
INTRAMUSCULAR | Status: AC
  Filled 2020-12-05: qty 2

## 2020-12-05 MED ORDER — OXYCODONE HCL 5 MG/5ML PO SOLN: 5.0000 mg | Freq: Once | ORAL | Status: DC | PRN

## 2020-12-05 MED ORDER — ORAL CARE MOUTH RINSE: 15.0000 mL | Freq: Once | OROMUCOSAL | Status: AC

## 2020-12-05 MED ORDER — DEXAMETHASONE SODIUM PHOSPHATE 10 MG/ML IJ SOLN
INTRAMUSCULAR | Status: DC | PRN
  Administered 2020-12-05: 10 mg via INTRAVENOUS

## 2020-12-05 MED ORDER — CEFAZOLIN SODIUM-DEXTROSE 2-4 GM/100ML-% IV SOLN
2.0000 g | INTRAVENOUS | Status: DC
  Filled 2020-12-05: qty 100

## 2020-12-05 MED ORDER — PHENYLEPHRINE 40 MCG/ML (10ML) SYRINGE FOR IV PUSH (FOR BLOOD PRESSURE SUPPORT)
PREFILLED_SYRINGE | INTRAVENOUS | Status: DC | PRN
  Administered 2020-12-05: 80 ug via INTRAVENOUS
  Administered 2020-12-05: 40 ug via INTRAVENOUS
  Administered 2020-12-05: 120 ug via INTRAVENOUS
  Administered 2020-12-05: 40 ug via INTRAVENOUS
  Administered 2020-12-05: 120 ug via INTRAVENOUS

## 2020-12-05 MED ORDER — METHOCARBAMOL 500 MG PO TABS: 500.0000 mg | ORAL_TABLET | Freq: Three times a day (TID) | ORAL | 0 refills | Status: DC | PRN

## 2020-12-05 MED ORDER — BUPIVACAINE-EPINEPHRINE 0.25% -1:200000 IJ SOLN
INTRAMUSCULAR | Status: DC | PRN
  Administered 2020-12-05: 10 mL
  Administered 2020-12-05: 20 mL

## 2020-12-05 MED ORDER — HYDROMORPHONE HCL 1 MG/ML IJ SOLN
INTRAMUSCULAR | Status: AC
  Filled 2020-12-05: qty 1

## 2020-12-05 MED ORDER — MIDAZOLAM HCL 2 MG/2ML IJ SOLN
INTRAMUSCULAR | Status: AC
  Filled 2020-12-05: qty 2

## 2020-12-05 MED ORDER — OXYCODONE HCL 5 MG PO TABS: 5.0000 mg | ORAL_TABLET | Freq: Once | ORAL | Status: DC | PRN

## 2020-12-05 MED ORDER — MIDAZOLAM HCL 2 MG/2ML IJ SOLN
INTRAMUSCULAR | Status: DC | PRN
  Administered 2020-12-05: 2 mg via INTRAVENOUS

## 2020-12-05 MED ORDER — PROPOFOL 10 MG/ML IV BOLUS
INTRAVENOUS | Status: DC | PRN
  Administered 2020-12-05: 10 mg via INTRAVENOUS
  Administered 2020-12-05: 40 mg via INTRAVENOUS
  Administered 2020-12-05: 150 mg via INTRAVENOUS

## 2020-12-05 MED ORDER — LIDOCAINE 2% (20 MG/ML) 5 ML SYRINGE
INTRAMUSCULAR | Status: AC
  Filled 2020-12-05: qty 5

## 2020-12-05 SURGICAL SUPPLY — 54 items
BAG COUNTER SPONGE SURGICOUNT (BAG) ×2 IMPLANT
BANDAGE ESMARK 6X9 LF (GAUZE/BANDAGES/DRESSINGS) IMPLANT
BLADE CLIPPER SURG (BLADE) IMPLANT
BLADE EXCALIBUR 4.0X13 (MISCELLANEOUS) ×2 IMPLANT
BNDG ELASTIC 4X5.8 VLCR STR LF (GAUZE/BANDAGES/DRESSINGS) ×2 IMPLANT
BNDG ELASTIC 6X10 VLCR STRL LF (GAUZE/BANDAGES/DRESSINGS) IMPLANT
BNDG ELASTIC 6X5.8 VLCR STR LF (GAUZE/BANDAGES/DRESSINGS) ×2 IMPLANT
BNDG ESMARK 6X9 LF (GAUZE/BANDAGES/DRESSINGS)
COVER SURGICAL LIGHT HANDLE (MISCELLANEOUS) ×2 IMPLANT
CUFF TOURN SGL QUICK 34 (TOURNIQUET CUFF) ×1
CUFF TOURN SGL QUICK 42 (TOURNIQUET CUFF) IMPLANT
CUFF TRNQT CYL 34X4.125X (TOURNIQUET CUFF) ×1 IMPLANT
CUTTER TENSIONER SUT 2-0 0 FBW (INSTRUMENTS) ×4 IMPLANT
DRAPE ARTHROSCOPY W/POUCH 114 (DRAPES) ×2 IMPLANT
DRAPE U-SHAPE 47X51 STRL (DRAPES) ×2 IMPLANT
DRSG TEGADERM 4X4.75 (GAUZE/BANDAGES/DRESSINGS) ×4 IMPLANT
DURAPREP 26ML APPLICATOR (WOUND CARE) ×2 IMPLANT
DW OUTFLOW CASSETTE/TUBE SET (MISCELLANEOUS) ×2 IMPLANT
GAUZE SPONGE 4X4 12PLY STRL (GAUZE/BANDAGES/DRESSINGS) ×2 IMPLANT
GAUZE XEROFORM 1X8 LF (GAUZE/BANDAGES/DRESSINGS) ×2 IMPLANT
GLOVE SRG 8 PF TXTR STRL LF DI (GLOVE) ×1 IMPLANT
GLOVE SURG LTX SZ8 (GLOVE) ×2 IMPLANT
GLOVE SURG UNDER POLY LF SZ8 (GLOVE) ×1
GOWN STRL REUS W/ TWL LRG LVL3 (GOWN DISPOSABLE) ×2 IMPLANT
GOWN STRL REUS W/ TWL XL LVL3 (GOWN DISPOSABLE) ×1 IMPLANT
GOWN STRL REUS W/TWL LRG LVL3 (GOWN DISPOSABLE) ×2
GOWN STRL REUS W/TWL XL LVL3 (GOWN DISPOSABLE) ×1
IMMOBILIZER KNEE 24 THIGH 36 (MISCELLANEOUS) ×1 IMPLANT
IMMOBILIZER KNEE 24 UNIV (MISCELLANEOUS) ×2
IMPL FIBERSTICH 2-0 CVD (Anchor) ×3 IMPLANT
IMPLANT FIBERSTICH 2-0 CVD (Anchor) ×6 IMPLANT
KIT BASIN OR (CUSTOM PROCEDURE TRAY) ×2 IMPLANT
KIT TURNOVER KIT B (KITS) ×2 IMPLANT
MANIFOLD NEPTUNE II (INSTRUMENTS) IMPLANT
NEEDLE 18GX1X1/2 (RX/OR ONLY) (NEEDLE) IMPLANT
NEEDLE HYPO 25GX1X1/2 BEV (NEEDLE) ×2 IMPLANT
NS IRRIG 1000ML POUR BTL (IV SOLUTION) IMPLANT
PACK ARTHROSCOPY DSU (CUSTOM PROCEDURE TRAY) ×2 IMPLANT
PAD ARMBOARD 7.5X6 YLW CONV (MISCELLANEOUS) ×4 IMPLANT
PAD CAST 4YDX4 CTTN HI CHSV (CAST SUPPLIES) ×1 IMPLANT
PADDING CAST COTTON 4X4 STRL (CAST SUPPLIES) ×1
PADDING CAST COTTON 6X4 STRL (CAST SUPPLIES) ×2 IMPLANT
PORT APPOLLO RF 90DEGREE MULTI (SURGICAL WAND) ×2 IMPLANT
PORTAL SKID DEVICE (INSTRUMENTS) ×2 IMPLANT
SPONGE T-LAP 4X18 ~~LOC~~+RFID (SPONGE) ×2 IMPLANT
SUT ETHILON 3 0 PS 1 (SUTURE) IMPLANT
SYR 20ML ECCENTRIC (SYRINGE) ×2 IMPLANT
SYR CONTROL 10ML LL (SYRINGE) IMPLANT
SYR TB 1ML LUER SLIP (SYRINGE) ×2 IMPLANT
TOWEL GREEN STERILE (TOWEL DISPOSABLE) ×2 IMPLANT
TOWEL GREEN STERILE FF (TOWEL DISPOSABLE) ×2 IMPLANT
TUBE CONNECTING 12X1/4 (SUCTIONS) ×2 IMPLANT
TUBING ARTHROSCOPY IRRIG 16FT (MISCELLANEOUS) ×2 IMPLANT
WATER STERILE IRR 1000ML POUR (IV SOLUTION) IMPLANT

## 2020-12-05 NOTE — Brief Op Note (Signed)
   12/05/2020  6:20 PM  PATIENT:  Cassidy Sanchez  50 y.o. female  PRE-OPERATIVE DIAGNOSIS:  right knee medial meniscal tear  POST-OPERATIVE DIAGNOSIS:  right knee medial meniscal tear  PROCEDURE:  Procedure(s): RIGHT KNEE ARTHROSCOPY, DEBRIDEMENT  SURGEON:  Surgeon(s): Cammy Copa, MD  ASSISTANT: magnant pa  ANESTHESIA:   general  EBL: 5 ml    Total I/O In: 750 [I.V.:700; IV Piggyback:50] Out: 5 [Blood:5]  BLOOD ADMINISTERED: none  DRAINS: none   LOCAL MEDICATIONS USED:  marcaine mso4 clonidine  SPECIMEN:  No Specimen  COUNTS:  YES  TOURNIQUET:  * Missing tourniquet times found for documented tourniquets in log: [id number] *  DICTATION: .Other Dictation: Dictation Number [id number]  PLAN OF CARE: Discharge to home after PACU  PATIENT DISPOSITION:  PACU - hemodynamically stable

## 2020-12-05 NOTE — Anesthesia Preprocedure Evaluation (Signed)
Anesthesia Evaluation   Patient awake    Reviewed: Allergy & Precautions, NPO status , Patient's Chart, lab work & pertinent test results  Airway Mallampati: I  TM Distance: >3 FB Neck ROM: Full    Dental no notable dental hx. (+) Teeth Intact, Dental Advisory Given   Pulmonary sleep apnea (No machine) ,    Pulmonary exam normal breath sounds clear to auscultation       Cardiovascular hypertension, + CAD and + Past MI  Normal cardiovascular exam+ dysrhythmias Supra Ventricular Tachycardia  Rhythm:Regular Rate:Normal  02/23/20 TTE 1. Left ventricular ejection fraction, by estimation, is 65 to 70%. The  left ventricle has normal function. The left ventricle has no regional  wall motion abnormalities. There is mild left ventricular hypertrophy.  Left ventricular diastolic parameters  are indeterminate.  2. Right ventricular systolic function is normal. The right ventricular  size is moderately enlarged.  3. The mitral valve is normal in structure. No evidence of mitral valve  regurgitation. No evidence of mitral stenosis.  4. The aortic valve is normal in structure. Aortic valve regurgitation is  not visualized. No aortic stenosis is present.  5. The inferior vena cava is normal in size with greater than 50%  respiratory variability, suggesting right atrial pressure of 3 mmHg.    Neuro/Psych    GI/Hepatic   Endo/Other    Renal/GU      Musculoskeletal   Abdominal (+) + obese (BMI 35.45),   Peds  Hematology  (+) anemia , Lab Results      Component                Value               Date                      WBC                      10.3                02/24/2020                HGB                      11.9 (L)            02/24/2020                HCT                      36.6                02/24/2020                MCV                      92.2                02/24/2020                PLT                       341                 02/24/2020              Anesthesia Other Findings All: Sulfa, Erythromycin, Latex  Reproductive/Obstetrics  Anesthesia Physical Anesthesia Plan  ASA: 3  Anesthesia Plan: General   Post-op Pain Management:    Induction: Intravenous  PONV Risk Score and Plan: 4 or greater and Ondansetron, Dexamethasone, Midazolam and Treatment may vary due to age or medical condition  Airway Management Planned: LMA  Additional Equipment: None  Intra-op Plan:   Post-operative Plan:   Informed Consent: I have reviewed the patients History and Physical, chart, labs and discussed the procedure including the risks, benefits and alternatives for the proposed anesthesia with the patient or authorized representative who has indicated his/her understanding and acceptance.     Dental advisory given  Plan Discussed with: Anesthesiologist and CRNA  Anesthesia Plan Comments: (LMA GA)        Anesthesia Quick Evaluation

## 2020-12-05 NOTE — H&P (Signed)
Cassidy Sanchez is an 50 y.o. female.   Chief Complaint: Right knee pain HPI:  Cassidy Sanchez is a 50 year old patient with persistent right knee pain.  States that the knee is starting to buckle.  Feels a pinching in the back of her knee and leg.  Tylenol 3 upsets her stomach.  Stiff in the morning and hard to stand.  She works from home.  She states she had a "mild heart attack" in February.  She is on Plavix.  No personal or family history of DVT or pulmonary embolism.  She reports that she did not have a stent placed  Past Medical History:  Diagnosis Date   Family history of anesthesia complication    mom nausea and vomited;confusion   Headache(784.0)    sinus   History of shingles    Hyperlipidemia    Hypertension    Joint swelling    right ankle   Leukocytosis 02/24/2020   PONV (postoperative nausea and vomiting)    S/P cardiac cath 02/23/20 with non obstructive disease but suggestion of vasopsasm vs potential distal SCAD 02/24/2020   Seasonal allergies    takes Zyrtec D as needed   SVT (supraventricular tachycardia) (HCC)    takes Lopressor daily    Past Surgical History:  Procedure Laterality Date   ANKLE SURGERY Right    CESAREAN SECTION     CHOLECYSTECTOMY N/A 04/13/2013   Procedure: LAPAROSCOPIC CHOLECYSTECTOMY;  Surgeon: Atilano Ina, MD;  Location: [facility] OR;  Service: General;  Laterality: N/A;   CHOLECYSTECTOMY     LEFT HEART CATH AND CORONARY ANGIOGRAPHY N/A 02/23/2020   Procedure: LEFT HEART CATH AND CORONARY ANGIOGRAPHY;  Surgeon: Lennette Bihari, MD;  Location: [facility];  Service: Cardiovascular;  Laterality: N/A;   NASAL SEPTUM SURGERY     TONSILLECTOMY     TUBAL LIGATION     uvula removed     VAGINAL HYSTERECTOMY      Family History  Problem Relation Age of Onset   High blood pressure Mother    Asthma Mother    Lung cancer Father        died in 54s   Social History:  reports that she has never smoked. She has never used smokeless tobacco. She  reports that she does not drink alcohol and does not use drugs.  Allergies:  Allergies  Allergen Reactions   Latex Hives and Itching   Erythromycin Hives   Sulfa Antibiotics Itching   Adhesive [Tape] Rash    Paper tape ok.    Codeine Hives and Itching    Medications Prior to Admission  Medication Sig Dispense Refill   acetaminophen (TYLENOL) 500 MG tablet Take 1,000 mg by mouth every 6 (six) hours as needed for headache or mild pain.     amitriptyline (ELAVIL) 10 MG tablet Take 10 mg by mouth at bedtime as needed for sleep.     aspirin 81 MG chewable tablet Chew 1 tablet (81 mg total) by mouth daily.     atorvastatin (LIPITOR) 80 MG tablet TAKE 1 TABLET(80 MG) BY MOUTH AT BEDTIME 30 tablet 6   buPROPion (WELLBUTRIN XL) 150 MG 24 hr tablet Take 150 mg by mouth daily.     clopidogrel (PLAVIX) 75 MG tablet Take 1 tablet (75 mg total) by mouth daily with breakfast. 30 tablet 11   HYDROcodone-acetaminophen (NORCO/VICODIN) 5-325 MG tablet Take 1 tablet by mouth every 12 (twelve) hours as needed for moderate pain. 20 tablet 0   lisinopril (  ZESTRIL) 10 MG tablet TAKE 1 TABLET(10 MG) BY MOUTH DAILY (Patient taking differently: Take 15 mg by mouth daily.) 90 tablet 1   nitroGLYCERIN (NITROSTAT) 0.4 MG SL tablet Place 1 tablet (0.4 mg total) under the tongue every 5 (five) minutes as needed for chest pain. 25 tablet 4   valACYclovir (VALTREX) 500 MG tablet Take 500 mg by mouth 3 (three) times daily as needed (flare-up).     verapamil (CALAN-SR) 240 MG CR tablet TAKE 1 TABLET(240 MG) BY MOUTH DAILY 30 tablet 6    Results for orders placed or performed during the hospital encounter of 12/05/20 (from the past 48 hour(s))  Basic metabolic panel     Status: Abnormal   Collection Time: 12/05/20  2:39 PM  Result Value Ref Range   Sodium 141 135 - 145 mmol/L   Potassium 3.0 (L) 3.5 - 5.1 mmol/L   Chloride 105 98 - 111 mmol/L   CO2 27 22 - 32 mmol/L   Glucose, Bld 103 (H) 70 - 99 mg/dL    Comment:  Glucose reference range applies only to samples taken after fasting for at least 8 hours.   BUN 7 6 - 20 mg/dL   Creatinine, Ser 0.62 0.44 - 1.00 mg/dL   Calcium 9.5 8.9 - 10.3 mg/dL   GFR, Estimated >60 >60 mL/min    Comment: (NOTE) Calculated using the CKD-EPI Creatinine Equation (2021)    Anion gap 9 5 - 15    Comment: Performed at [facility] [address], [place] [address]  CBC     Status: None   Collection Time: 12/05/20  2:39 PM  Result Value Ref Range   WBC 7.8 4.0 - 10.5 K/uL   RBC 4.39 3.87 - 5.11 MIL/uL   Hemoglobin 13.2 12.0 - 15.0 g/dL   HCT 40.2 36.0 - 46.0 %   MCV 91.6 80.0 - 100.0 fL   MCH 30.1 26.0 - 34.0 pg   MCHC 32.8 30.0 - 36.0 g/dL   RDW 11.8 11.5 - 15.5 %   Platelets 337 150 - 400 K/uL   nRBC 0.0 0.0 - 0.2 %    Comment: Performed at [facility], [place] [address] [place], [place] [address]   No results found.  Review of Systems  Musculoskeletal:  Positive for arthralgias.  All other systems reviewed and are negative.  Height 5\' 5"  (1.651 m), weight 96.6 kg. Physical Exam Vitals reviewed.  HENT:     Head: Normocephalic.     Nose: Nose normal.     Mouth/Throat:     Mouth: Mucous membranes are moist.  Eyes:     Pupils: Pupils are equal, round, and reactive to light.  Cardiovascular:     Rate and Rhythm: Normal rate.     Pulses: Normal pulses.  Pulmonary:     Effort: Pulmonary effort is normal.  Abdominal:     General: Abdomen is flat.  Musculoskeletal:     Cervical back: Normal range of motion.  Skin:    General: Skin is warm.     Capillary Refill: Capillary refill takes less than 2 seconds.  Neurological:     General: No focal deficit present.     Mental Status: She is alert.  Psychiatric:        Mood and Affect: Mood normal.   Ortho exam demonstrates full active and passive range of motion of the right knee.  Has medial greater than lateral joint line tenderness.  Symmetric mild  patellofemoral crepitus with no  effusion in either knee.  No groin pain with internal or external rotation of the right leg.  Pedal pulses palpable.  Collateral and cruciate ligaments stable.  Assessment/Plan  Impression is right knee pain with continued medial and posterior symptoms.  MRI scan is reviewed.  Although no definitive discrete tear is identified there is a change in morphology of that medial meniscus from the typical triangular  shape to more of a pentagon shaped.  I think this could represent an occult tear.  Patient would like to have arthroscopic evaluation just so she knows.  I did discuss with her the risk and benefits of the surgery including but not limited to infection nerve and vessel damage knee stiffness as well as the chance that this may not help her knee especially if that meniscus is normal.  Patient understands the risk and benefits.  Preoperative risk stratification has indicated that it is acceptable to hold aspirin and Plavix prior to the procedure and resume them as soon as it is safe to afterwards.  We will resume them tonight. Anderson Malta, MD 12/05/2020, 4:03 PM

## 2020-12-05 NOTE — Transfer of Care (Signed)
Immediate Anesthesia Transfer of Care Note  Patient: ALYDA MEGNA  Procedure(s) Performed: RIGHT KNEE ARTHROSCOPY, DEBRIDEMENT (Right: Knee)  Patient Location: PACU  Anesthesia Type:General  Level of Consciousness: drowsy and patient cooperative  Airway & Oxygen Therapy: Patient Spontanous Breathing  Post-op Assessment: Report given to RN and Post -op Vital signs reviewed and stable  Post vital signs: Reviewed and stable  Last Vitals:  Vitals Value Taken Time  BP 133/69 12/05/20 1806  Temp    Pulse 91 12/05/20 1808  Resp 14 12/05/20 1808  SpO2 95 % 12/05/20 1808  Vitals shown include unvalidated device data.  Last Pain: There were no vitals filed for this visit.       Complications: No notable events documented.

## 2020-12-05 NOTE — Anesthesia Procedure Notes (Signed)
Procedure Name: LMA Insertion Date/Time: 12/05/2020 4:34 PM Performed by: Dorie Rank, CRNA Pre-anesthesia Checklist: Patient identified, Emergency Drugs available, Suction available and Patient being monitored Patient Re-evaluated:Patient Re-evaluated prior to induction Oxygen Delivery Method: Circle System Utilized Preoxygenation: Pre-oxygenation with 100% oxygen Induction Type: IV induction Ventilation: Mask ventilation without difficulty LMA: LMA inserted LMA Size: 4.0 Number of attempts: 1 Airway Equipment and Method: Bite block Placement Confirmation: positive ETCO2 Tube secured with: Tape Dental Injury: Teeth and Oropharynx as per pre-operative assessment

## 2020-12-06 NOTE — Op Note (Signed)
NAMESHONTIA, GILLOOLY MEDICAL RECORD NO: [id number] ACCOUNT NO: [id number] DATE OF BIRTH: September 29, 1970 FACILITY: [facility] LOCATION: [facility] PHYSICIAN: Graylin Shiver. August Saucer, MD  Operative Report   PREOPERATIVE DIAGNOSIS:  Right knee medial meniscal tear.  POSTOPERATIVE DIAGNOSIS:  Right knee medial meniscal tear.  PROCEDURE:  Right knee arthroscopy with medial meniscal repair.  SURGEON:  Graylin Shiver. August Saucer, MD  ASSISTANT:  Karenann Cai, PA  INDICATIONS:  The patient is a 50 year old patient with medial-sided knee pain and mechanical symptoms with a typical meniscal morphology on MRI scan and presents for operative management after failure of conservative treatment.  DESCRIPTION OF PROCEDURE:  The patient was brought to the operating room where general anesthetic was induced.  Preoperative antibiotics were administered.  Timeout was called.  Right leg examined under anesthesia and found to have full range of motion,  stable collateral cruciate ligaments and no popping or clicking with passive range of motion.  The patient's right leg was pre-scrubbed with alcohol, Betadine, and allowed to air dry, prepped with DuraPrep solution and draped in a sterile manner.  Collier Flowers  was not utilized for this case.  A timeout was called.  Anterior inferolateral and anterior inferomedial portal locations were anesthetized with 5 mL of Marcaine with epinephrine.  Portals were created.  Diagnostic arthroscopy was performed.  The patient  had intact ACL, PCL.  Lateral compartment intact with intact articular cartilage and meniscus.  Patellofemoral joint also intact.  Medial compartment had intact articular cartilage, but there was a tear through the superior meniscal capsular junction  over about a 1.5 to almost 2 cm area in the posteromedial aspect of the meniscus.  Inferiorly, the meniscus was stable. Superiorly, the meniscus was unstable and could be subluxated into the joint.  This area was prepared with a meniscal  rasp.  Next,  three Arthrex all-inside suture anchors were used to tether the meniscus back to the capsule.  This was done with the knee in approximately 20 degrees of flexion.  Secured fixation was achieved, checked with the probe.  At this time, thorough irrigation  was performed.  Instruments were removed.  Knee was taken through a range of motion before removing the instruments and the meniscus was found to be stable and well fixed to the surrounding capsule.  Next, the portals were closed using 3-0 nylon.  A  solution of Marcaine, morphine, clonidine injected into the knee for postoperative pain relief.  The patient tolerated the procedure well without immediate complication.  A bulky dressing was applied.  Impervious dressings applied prior to the bulky  dressing, knee immobilizer applied.  Partial weightbearing with crutches with followup in 1 week.  Luke's assistance was required for limb positioning, opening and closing.  His assistance was a medical necessity.   NIK D: 12/05/2020 6:24:29 pm T: 12/06/2020 12:31:00 am  JOB: [id number]/ [id number]

## 2020-12-06 NOTE — Anesthesia Postprocedure Evaluation (Signed)
Anesthesia Post Note  Patient: Cassidy Sanchez  Procedure(s) Performed: RIGHT KNEE ARTHROSCOPY, DEBRIDEMENT (Right: Knee)     Patient location during evaluation: PACU Anesthesia Type: General Level of consciousness: awake and alert Pain management: pain level controlled Vital Signs Assessment: post-procedure vital signs reviewed and stable Respiratory status: spontaneous breathing, nonlabored ventilation, respiratory function stable and patient connected to nasal cannula oxygen Cardiovascular status: blood pressure returned to baseline and stable Postop Assessment: no apparent nausea or vomiting Anesthetic complications: no   No notable events documented.  Last Vitals:  Vitals:   12/05/20 1850 12/05/20 1905  BP: 131/73 122/68  Pulse: 79 84  Resp: 10 20  Temp:  36.6 C  SpO2: 100% 97%    Last Pain:  Vitals:   12/05/20 1905  PainSc: 4                  Trevor Iha

## 2020-12-09 ENCOUNTER — Encounter (HOSPITAL_COMMUNITY): Payer: Self-pay | Admitting: Orthopedic Surgery

## 2020-12-12 ENCOUNTER — Encounter: Payer: Self-pay | Admitting: Orthopedic Surgery

## 2020-12-12 ENCOUNTER — Ambulatory Visit (INDEPENDENT_AMBULATORY_CARE_PROVIDER_SITE_OTHER): Payer: BC Managed Care – PPO | Admitting: Surgical

## 2020-12-12 ENCOUNTER — Other Ambulatory Visit: Payer: Self-pay

## 2020-12-12 DIAGNOSIS — Z9889 Other specified postprocedural states: Secondary | ICD-10-CM

## 2020-12-12 NOTE — Progress Notes (Signed)
Post-Op Visit Note   Patient: Cassidy Sanchez           Date of Birth: 1970-03-22           MRN: [id number] Visit Date: 12/12/2020 PCP: Burnis Medin, PA-C   Assessment & Plan:  Chief Complaint:  Chief Complaint  Patient presents with   Right Knee - Routine Post Op    12/05/20 (7d)  Right Knee Arthroscopy, Debridement - Right     Visit Diagnoses:  1. Status post medial meniscal repair     Plan: Patient is a 50 year old female who presents s/p right knee arthroscopy with meniscal repair on 12/05/2020.  She has kept her leg straight at all times with occasional 50% weightbearing with crutches.  She states pain is improving rapidly since surgery.  She only takes oxycodone 2 times per day on average.  Denies any chest pain or shortness of breath or calf pain.  She is taking aspirin and Plavix that she takes normally.  She denies any injury since surgery.  She has not been bending her leg at all.  On examination patient has 0 degrees of extension and approximately 60 degrees of knee flexion.  No calf tenderness.  Negative Homans' sign.  Positive effusion.  She is able to perform straight leg raise without extensor lag.  Incisions are healing well without any evidence of infection or dehiscence.  Sutures were placed with Steri-Strips today.  Intact DP pulse of the operative extremity with intact ankle dorsiflexion.  Plan is to allow patient to weight-bear in extension starting with 50% weightbearing and increasing her weightbearing status to weightbearing as tolerated in extension over the next several weeks.  No flexion at all with weightbearing.  She may flex her knee when she is sedentary 0 to 90 degrees.  CPM machine will be set up for her.  Follow-up in 3 weeks for clinical recheck and initiation of full weightbearing with a flexion at that time.  Patient agreed with plan.  Follow-up in 3 weeks.  Follow-Up Instructions: No follow-ups on file.   Orders:  No orders of the  defined types were placed in this encounter.  No orders of the defined types were placed in this encounter.   Imaging: No results found.  PMFS History: Patient Active Problem List   Diagnosis Date Noted   CAD in native artery 02/24/2020   S/P cardiac cath 02/23/20 with non obstructive disease but suggestion of vasopsasm vs potential distal SCAD 02/24/2020   Leukocytosis 02/24/2020   ACS (acute coronary syndrome) (HCC) 02/23/2020   NSTEMI (non-ST elevated myocardial infarction) (HCC) 02/23/2020   HYPERLIPIDEMIA 10/04/2009   HERPES ZOSTER 10/03/2009   GLUCOSE INTOLERANCE 10/03/2009   HYPOKALEMIA 10/03/2009   ANEMIA 10/03/2009   Essential hypertension 10/03/2009   UTI 10/03/2009   DIZZINESS 10/03/2009   SLEEP APNEA 10/03/2009   Paroxysmal SVT (supraventricular tachycardia) (HCC) 10/03/2009   Past Medical History:  Diagnosis Date   Family history of anesthesia complication    mom nausea and vomited;confusion   Headache(784.0)    sinus   History of shingles    Hyperlipidemia    Hypertension    Joint swelling    right ankle   Leukocytosis 02/24/2020   PONV (postoperative nausea and vomiting)    S/P cardiac cath 02/23/20 with non obstructive disease but suggestion of vasopsasm vs potential distal SCAD 02/24/2020   Seasonal allergies    takes Zyrtec D as needed   SVT (supraventricular tachycardia) (HCC)  takes Lopressor daily    Family History  Problem Relation Age of Onset   High blood pressure Mother    Asthma Mother    Lung cancer Father        died in 13s    Past Surgical History:  Procedure Laterality Date   ANKLE SURGERY Right    CESAREAN SECTION     CHOLECYSTECTOMY N/A 04/13/2013   Procedure: LAPAROSCOPIC CHOLECYSTECTOMY;  Surgeon: Atilano Ina, MD;  Location: [facility] OR;  Service: General;  Laterality: N/A;   CHOLECYSTECTOMY     KNEE ARTHROSCOPY Right 12/05/2020   Procedure: RIGHT KNEE ARTHROSCOPY, DEBRIDEMENT;  Surgeon: Cammy Copa, MD;  Location: [facility] OR;   Service: Orthopedics;  Laterality: Right;   LEFT HEART CATH AND CORONARY ANGIOGRAPHY N/A 02/23/2020   Procedure: LEFT HEART CATH AND CORONARY ANGIOGRAPHY;  Surgeon: Lennette Bihari, MD;  Location: [facility];  Service: Cardiovascular;  Laterality: N/A;   NASAL SEPTUM SURGERY     TONSILLECTOMY     TUBAL LIGATION     uvula removed     VAGINAL HYSTERECTOMY     Social History   Occupational History   Not on file  Tobacco Use   Smoking status: Never   Smokeless tobacco: Never  Vaping Use   Vaping Use: Never used  Substance and Sexual Activity   Alcohol use: No   Drug use: No   Sexual activity: Not on file

## 2020-12-13 ENCOUNTER — Other Ambulatory Visit: Payer: Self-pay | Admitting: Surgical

## 2020-12-13 MED ORDER — OXYCODONE HCL 5 MG PO TABS: 5.0000 mg | ORAL_TABLET | Freq: Four times a day (QID) | ORAL | 0 refills | Status: DC | PRN

## 2020-12-13 MED ORDER — METHOCARBAMOL 500 MG PO TABS: 500.0000 mg | ORAL_TABLET | Freq: Three times a day (TID) | ORAL | 0 refills | Status: DC | PRN

## 2020-12-30 DIAGNOSIS — S83241A Other tear of medial meniscus, current injury, right knee, initial encounter: Secondary | ICD-10-CM

## 2021-01-09 ENCOUNTER — Other Ambulatory Visit: Payer: Self-pay

## 2021-01-09 ENCOUNTER — Ambulatory Visit: Payer: 59 | Admitting: Cardiovascular Disease

## 2021-01-09 ENCOUNTER — Encounter: Payer: Self-pay | Admitting: Orthopedic Surgery

## 2021-01-09 ENCOUNTER — Ambulatory Visit (INDEPENDENT_AMBULATORY_CARE_PROVIDER_SITE_OTHER): Payer: PRIVATE HEALTH INSURANCE | Admitting: Surgical

## 2021-01-09 DIAGNOSIS — Z9889 Other specified postprocedural states: Secondary | ICD-10-CM

## 2021-01-09 NOTE — Progress Notes (Signed)
Post-Op Visit Note   Patient: Cassidy Sanchez           Date of Birth: 06-18-1970           MRN: [id number] Visit Date: 01/09/2021 PCP: Burnis Medin, PA-C   Assessment & Plan:  Chief Complaint:  Chief Complaint  Patient presents with   Right Knee - Routine Post Op    12/05/20 (5w) Right Knee Arthroscopy, Debridement - Right     Visit Diagnoses:  1. Status post medial meniscal repair     Plan: Patient is a 51 year old female who presents s/p right knee arthroscopy with medial meniscal repair on 12/05/2020.  She feels she is doing well.  After her last appointment, she states that she used her knee immobilizer with ambulation for about 1 week and then ditched this for walking with a crutch without the knee immobilizer.  She then resumed walking normally which she has been doing for about the past 2 and half weeks.  She walks about 50 yards at a time without difficulty.  She is ascending stairs at her house.  She feels she is 60% improved compared with preoperatively.  Taking her Plavix daily.  Denies any chest pain or shortness of breath or any calf pain.  Denies any mechanical symptoms or instability of the knee.  On exam she has 0 degrees extension and 110 degrees of knee flexion.  Excellent quad strength rated 5/5.  No calf tenderness.  Negative Homans' sign.  Mild effusion is present.  Mild joint line tenderness over the medial joint line.  Incisions are healing well with a little bit of eschar over the medial portal incision.  There was no suture material visible underneath the eschar.  Plan is continue working on knee range of motion exercises.  Okay for weightbearing as tolerated for flat ground walking.  Recommended patient avoid ascending/descending stairs or walking on uneven terrain or any athletic activity such as running/jumping.  She did not exactly adhere to the plan of only weightbearing in extension that was discussed at the last visit but seems she is doing  well clinically.  4-week return for clinical recheck with Dr. August Saucer.  Follow-Up Instructions: No follow-ups on file.   Orders:  No orders of the defined types were placed in this encounter.  No orders of the defined types were placed in this encounter.   Imaging: No results found.  PMFS History: Patient Active Problem List   Diagnosis Date Noted   Acute medial meniscus tear of right knee    CAD in native artery 02/24/2020   S/P cardiac cath 02/23/20 with non obstructive disease but suggestion of vasopsasm vs potential distal SCAD 02/24/2020   Leukocytosis 02/24/2020   ACS (acute coronary syndrome) (HCC) 02/23/2020   NSTEMI (non-ST elevated myocardial infarction) (HCC) 02/23/2020   HYPERLIPIDEMIA 10/04/2009   HERPES ZOSTER 10/03/2009   GLUCOSE INTOLERANCE 10/03/2009   HYPOKALEMIA 10/03/2009   ANEMIA 10/03/2009   Essential hypertension 10/03/2009   UTI 10/03/2009   DIZZINESS 10/03/2009   SLEEP APNEA 10/03/2009   Paroxysmal SVT (supraventricular tachycardia) (HCC) 10/03/2009   Past Medical History:  Diagnosis Date   Family history of anesthesia complication    mom nausea and vomited;confusion   Headache(784.0)    sinus   History of shingles    Hyperlipidemia    Hypertension    Joint swelling    right ankle   Leukocytosis 02/24/2020   PONV (postoperative nausea and vomiting)    S/P cardiac cath  02/23/20 with non obstructive disease but suggestion of vasopsasm vs potential distal SCAD 02/24/2020   Seasonal allergies    takes Zyrtec D as needed   SVT (supraventricular tachycardia) (HCC)    takes Lopressor daily    Family History  Problem Relation Age of Onset   High blood pressure Mother    Asthma Mother    Lung cancer Father        died in 77s    Past Surgical History:  Procedure Laterality Date   ANKLE SURGERY Right    CESAREAN SECTION     CHOLECYSTECTOMY N/A 04/13/2013   Procedure: LAPAROSCOPIC CHOLECYSTECTOMY;  Surgeon: Atilano Ina, MD;  Location: [facility] OR;   Service: General;  Laterality: N/A;   CHOLECYSTECTOMY     KNEE ARTHROSCOPY Right 12/05/2020   Procedure: RIGHT KNEE ARTHROSCOPY, DEBRIDEMENT;  Surgeon: Cammy Copa, MD;  Location: [facility] OR;  Service: Orthopedics;  Laterality: Right;   LEFT HEART CATH AND CORONARY ANGIOGRAPHY N/A 02/23/2020   Procedure: LEFT HEART CATH AND CORONARY ANGIOGRAPHY;  Surgeon: Lennette Bihari, MD;  Location: [facility];  Service: Cardiovascular;  Laterality: N/A;   NASAL SEPTUM SURGERY     TONSILLECTOMY     TUBAL LIGATION     uvula removed     VAGINAL HYSTERECTOMY     Social History   Occupational History   Not on file  Tobacco Use   Smoking status: Never   Smokeless tobacco: Never  Vaping Use   Vaping Use: Never used  Substance and Sexual Activity   Alcohol use: No   Drug use: No   Sexual activity: Not on file

## 2021-01-15 ENCOUNTER — Telehealth: Payer: Self-pay

## 2021-01-15 NOTE — Telephone Encounter (Signed)
Spoke with patient, see chart.    

## 2021-01-15 NOTE — Telephone Encounter (Signed)
Called pt to get clarification on the message she sent. Pt was originally prescribed Wellbutrin by her PCP but it was resubmitted by Judy Pimple. Pt made away she will have to follow up with her PCP for further refills on this medication. She verbalized understanding.

## 2021-01-28 ENCOUNTER — Other Ambulatory Visit: Payer: Self-pay | Admitting: Cardiology

## 2021-02-10 ENCOUNTER — Ambulatory Visit: Payer: PRIVATE HEALTH INSURANCE | Admitting: Orthopedic Surgery

## 2021-02-19 ENCOUNTER — Other Ambulatory Visit: Payer: Self-pay | Admitting: Cardiovascular Disease

## 2021-02-21 ENCOUNTER — Ambulatory Visit (INDEPENDENT_AMBULATORY_CARE_PROVIDER_SITE_OTHER): Payer: PRIVATE HEALTH INSURANCE | Admitting: Orthopedic Surgery

## 2021-02-21 ENCOUNTER — Other Ambulatory Visit: Payer: Self-pay

## 2021-02-21 DIAGNOSIS — Z9889 Other specified postprocedural states: Secondary | ICD-10-CM

## 2021-02-22 ENCOUNTER — Encounter: Payer: Self-pay | Admitting: Orthopedic Surgery

## 2021-02-22 NOTE — Progress Notes (Signed)
Post-Op Visit Note   Patient: Cassidy Sanchez           Date of Birth: 1970-05-15           MRN: [id number] Visit Date: 02/21/2021 PCP: Cassidy Medin, PA-C   Assessment & Plan:  Chief Complaint:  Chief Complaint  Patient presents with   Right Knee - Routine Post Op   Visit Diagnoses:  1. Status post medial meniscal repair     Plan: Cassidy Sanchez is a 51 year old patient who underwent right knee arthroscopy and debridement 12/05/2020.  Doing well with no problems.  Did not do physical therapy.  She is walking regularly 2 miles on the flat ground.  Biking 10 minutes at a time.  She has some job travel requires her going to [place].  On exam she has no effusion.  No Homans and no calf tenderness.  Range of motion is excellent.  Quad strength also improving nicely.  Plan is to continue with nonweightbearing quad strengthening exercises.  Follow-up with [place] as needed.  Overall she is done very well following her knee arthroscopy and debridement.  Follow-Up Instructions: Return if symptoms worsen or fail to improve.   Orders:  No orders of the defined types were placed in this encounter.  No orders of the defined types were placed in this encounter.   Imaging: No results found.  PMFS History: Patient Active Problem List   Diagnosis Date Noted   Acute medial meniscus tear of right knee    CAD in native artery 02/24/2020   S/P cardiac cath 02/23/20 with non obstructive disease but suggestion of vasopsasm vs potential distal SCAD 02/24/2020   Leukocytosis 02/24/2020   ACS (acute coronary syndrome) (HCC) 02/23/2020   NSTEMI (non-ST elevated myocardial infarction) (HCC) 02/23/2020   HYPERLIPIDEMIA 10/04/2009   HERPES ZOSTER 10/03/2009   GLUCOSE INTOLERANCE 10/03/2009   HYPOKALEMIA 10/03/2009   ANEMIA 10/03/2009   Essential hypertension 10/03/2009   UTI 10/03/2009   DIZZINESS 10/03/2009   SLEEP APNEA 10/03/2009   Paroxysmal SVT (supraventricular tachycardia) (HCC)  10/03/2009   Past Medical History:  Diagnosis Date   Family history of anesthesia complication    mom nausea and vomited;confusion   Headache(784.0)    sinus   History of shingles    Hyperlipidemia    Hypertension    Joint swelling    right ankle   Leukocytosis 02/24/2020   PONV (postoperative nausea and vomiting)    S/P cardiac cath 02/23/20 with non obstructive disease but suggestion of vasopsasm vs potential distal SCAD 02/24/2020   Seasonal allergies    takes Zyrtec D as needed   SVT (supraventricular tachycardia) (HCC)    takes Lopressor daily    Family History  Problem Relation Age of Onset   High blood pressure Mother    Asthma Mother    Lung cancer Father        died in 25s    Past Surgical History:  Procedure Laterality Date   ANKLE SURGERY Right    CESAREAN SECTION     CHOLECYSTECTOMY N/A 04/13/2013   Procedure: LAPAROSCOPIC CHOLECYSTECTOMY;  Surgeon: Atilano Ina, MD;  Location[facility] OR;  Service: General;  Laterality: N/A;   CHOLECYSTECTOMY     KNEE ARTHROSCOPY Right 12/05/2020   Procedure: RIGHT KNEE ARTHROSCOPY, DEBRIDEMENT;  Surgeon: Cammy Copa, MD;  Location: [facility] OR;  Service: Orthopedics;  Laterality: Right;   LEFT HEART CATH AND CORONARY ANGIOGRAPHY N/A 02/23/2020   Procedure: LEFT HEART CATH AND CORONARY ANGIOGRAPHY;  Surgeon: Lennette Bihari, MD;  Location: [facility] INVASIVE CV LAB;  Service: Cardiovascular;  Laterality: N/A;   NASAL SEPTUM SURGERY     TONSILLECTOMY     TUBAL LIGATION     uvula removed     VAGINAL HYSTERECTOMY     Social History   Occupational History   Not on file  Tobacco Use   Smoking status: Never   Smokeless tobacco: Never  Vaping Use   Vaping Use: Never used  Substance and Sexual Activity   Alcohol use: No   Drug use: No   Sexual activity: Not on file

## 2021-03-17 ENCOUNTER — Other Ambulatory Visit: Payer: Self-pay | Admitting: Cardiology

## 2021-03-17 ENCOUNTER — Other Ambulatory Visit: Payer: Self-pay | Admitting: Cardiovascular Disease

## 2021-03-17 NOTE — Telephone Encounter (Signed)
DUE FOR F/U APPT ?

## 2021-03-17 NOTE — Telephone Encounter (Signed)
DUE FOR FOLLOW UP APPT

## 2021-05-26 ENCOUNTER — Other Ambulatory Visit: Payer: Self-pay | Admitting: Cardiology

## 2021-05-26 ENCOUNTER — Other Ambulatory Visit: Payer: Self-pay | Admitting: Cardiovascular Disease

## 2021-07-24 ENCOUNTER — Ambulatory Visit (INDEPENDENT_AMBULATORY_CARE_PROVIDER_SITE_OTHER): Payer: PRIVATE HEALTH INSURANCE | Admitting: Nurse Practitioner

## 2021-07-24 ENCOUNTER — Encounter: Payer: Self-pay | Admitting: Nurse Practitioner

## 2021-07-24 VITALS — BP 142/74 | HR 67 | Ht 65.0 in | Wt 239.4 lb

## 2021-07-24 DIAGNOSIS — I2510 Atherosclerotic heart disease of native coronary artery without angina pectoris: Secondary | ICD-10-CM | POA: Diagnosis not present

## 2021-07-24 DIAGNOSIS — E785 Hyperlipidemia, unspecified: Secondary | ICD-10-CM | POA: Diagnosis not present

## 2021-07-24 DIAGNOSIS — E669 Obesity, unspecified: Secondary | ICD-10-CM

## 2021-07-24 DIAGNOSIS — R7303 Prediabetes: Secondary | ICD-10-CM

## 2021-07-24 DIAGNOSIS — I10 Essential (primary) hypertension: Secondary | ICD-10-CM | POA: Diagnosis not present

## 2021-07-24 NOTE — Progress Notes (Signed)
Office Visit    Patient Name: Cassidy Sanchez Date of Encounter: 07/24/2021  Primary Care Provider:  Burnis Medin, [place] Primary Cardiologist:  Nicki Guadalajara, MD  Chief Complaint    51 year old female with a history of NSTEMI (no significant CAD-suspicion for SCAD), SVT, hypertension, hyperlipidemia, and prediabetes who presents for follow-up related to CAD and hypertension.  Past Medical History    Past Medical History:  Diagnosis Date   Family history of anesthesia complication    mom nausea and vomited;confusion   Headache(784.0)    sinus   History of shingles    Hyperlipidemia    Hypertension    Joint swelling    right ankle   Leukocytosis 02/24/2020   PONV (postoperative nausea and vomiting)    S/P cardiac cath 02/23/20 with non obstructive disease but suggestion of vasopsasm vs potential distal SCAD 02/24/2020   Seasonal allergies    takes Zyrtec D as needed   SVT (supraventricular tachycardia) (HCC)    takes Lopressor daily   Past Surgical History:  Procedure Laterality Date   ANKLE SURGERY Right    CESAREAN SECTION     CHOLECYSTECTOMY N/A 04/13/2013   Procedure: LAPAROSCOPIC CHOLECYSTECTOMY;  Surgeon: Atilano Ina, MD;  Location: [facility] OR;  Service: General;  Laterality: N/A;   CHOLECYSTECTOMY     KNEE ARTHROSCOPY Right 12/05/2020   Procedure: RIGHT KNEE ARTHROSCOPY, DEBRIDEMENT;  Surgeon: Cammy Copa, MD;  Location: [facility] OR;  Service: Orthopedics;  Laterality: Right;   LEFT HEART CATH AND CORONARY ANGIOGRAPHY N/A 02/23/2020   Procedure: LEFT HEART CATH AND CORONARY ANGIOGRAPHY;  Surgeon: Lennette Bihari, MD;  Location: [facility] INVASIVE CV LAB;  Service: Cardiovascular;  Laterality: N/A;   NASAL SEPTUM SURGERY     TONSILLECTOMY     TUBAL LIGATION     uvula removed     VAGINAL HYSTERECTOMY      Allergies  Allergies  Allergen Reactions   Latex Hives and Itching   Erythromycin Hives   Sulfa Antibiotics Itching   Adhesive [Tape] Rash    Paper tape  ok.    Codeine Hives and Itching    History of Present Illness    51 year old female with the above past medical history including NSTEMI (no significant CAD-suspicion for SCAD), SVT, hypertension, hyperlipidemia, and prediabetes.   SHe was hospitalized in February 2022 with chest pain, nausea, and vomiting.  EKG was nonischemic but repeat EKG showed nonspecific ST abnormalities.  Cardiac catheterization showed distal LAD stenosis of 90%, normal LVEF.  There was concern for vasospasm versus potential distal SCAD.  She was placed on aspirin and Plavix and started on verapamil in the setting of history of SVT and vasospasm. Lisinopril was resumed and simvastatin was switched to atorvastatin 80 mg daily.  She was last seen in the clinic on 11/22/2020 and was stable from a cardiac standpoint.  She was cleared for knee surgery at the time.  She denied any symptoms concerning for angina.  She presents today for follow-up.  Since her last visit she has been stable from a cardiac standpoint. She is active and walks regularly.  She notes occasional chest tightness with episodes of anxiety, she denies any exertional symptoms concerning for angina.  She is frustrated that she has been unable to lose weight.  Otherwise, she reports feeling well and denies any additional concerns today.  Home Medications    Current Outpatient Medications  Medication Sig Dispense Refill   verapamil (CALAN-SR) 240 MG CR tablet Take 1  tablet (240 mg total) by mouth daily. PLEASE SCHEDULE AN APPOINTMENT FOR FUTURE REFILLS 90 tablet 1   acetaminophen (TYLENOL) 500 MG tablet Take 1,000 mg by mouth every 6 (six) hours as needed for headache or mild pain.     amitriptyline (ELAVIL) 10 MG tablet Take 10 mg by mouth at bedtime as needed for sleep.     aspirin 81 MG chewable tablet Chew 1 tablet (81 mg total) by mouth daily.     atorvastatin (LIPITOR) 80 MG tablet Take 1 tablet (80 mg total) by mouth daily. PLEASE SCHEDULE AN  APPOINTMENT FOR FUTURE REFILLS 90 tablet 0   buPROPion (WELLBUTRIN XL) 150 MG 24 hr tablet Take 150 mg by mouth daily.     clopidogrel (PLAVIX) 75 MG tablet Take 1 tablet (75 mg total) by mouth daily. PLEASE SCHEDULE AN APPOINTMENT FOR FUTURE REFILLS 90 tablet 0   lisinopril (ZESTRIL) 10 MG tablet TAKE 1 TABLET(10 MG) BY MOUTH DAILY 90 tablet 1   methocarbamol (ROBAXIN) 500 MG tablet Take 1 tablet (500 mg total) by mouth every 8 (eight) hours as needed. 30 tablet 0   nitroGLYCERIN (NITROSTAT) 0.4 MG SL tablet Place 1 tablet (0.4 mg total) under the tongue every 5 (five) minutes as needed for chest pain. 25 tablet 4   oxyCODONE (ROXICODONE) 5 MG immediate release tablet Take 1 tablet (5 mg total) by mouth every 6 (six) hours as needed for severe pain. 30 tablet 0   valACYclovir (VALTREX) 500 MG tablet Take 500 mg by mouth 3 (three) times daily as needed (flare-up).     No current facility-administered medications for this visit.     Review of Systems    She denies chest pain, palpitations, dyspnea, pnd, orthopnea, n, v, dizziness, syncope, edema, weight gain, or early satiety. All other systems reviewed and are otherwise negative except as noted above.   Physical Exam    VS:  BP (!) 142/74   Pulse 67   Ht 5\' 5"  (1.651 m)   Wt 239 lb 6.4 oz (108.6 kg)   SpO2 97%   BMI 39.84 kg/m  GEN: Well nourished, well developed, in no acute distress. HEENT: normal. Neck: Supple, no JVD, carotid bruits, or masses. Cardiac: RRR, no murmurs, rubs, or gallops. No clubbing, cyanosis, edema.  Radials/DP/PT 2+ and equal bilaterally.  Respiratory:  Respirations regular and unlabored, clear to auscultation bilaterally. GI: Soft, nontender, nondistended, BS + x 4. MS: no deformity or atrophy. Skin: warm and dry, no rash. Neuro:  Strength and sensation are intact. Psych: Normal affect.  Accessory Clinical Findings    ECG personally reviewed by me today - No EKG in office today - no acute changes.  Lab  Results  Component Value Date   WBC 7.8 12/05/2020   HGB 13.2 12/05/2020   HCT 40.2 12/05/2020   MCV 91.6 12/05/2020   PLT 337 12/05/2020   Lab Results  Component Value Date   CREATININE 0.62 12/05/2020   BUN 7 12/05/2020   NA 141 12/05/2020   K 3.0 (L) 12/05/2020   CL 105 12/05/2020   CO2 27 12/05/2020   Lab Results  Component Value Date   ALT 16 02/17/2013   AST 16 02/17/2013   ALKPHOS 77 02/17/2013   BILITOT 0.3 02/17/2013   Lab Results  Component Value Date   CHOL 251 (H) 10/03/2009   HDL 43 10/03/2009   LDLCALC 180 (H) 10/03/2009   TRIG 139 10/03/2009   CHOLHDL 5.8 Ratio 10/03/2009  Lab Results  Component Value Date   HGBA1C 6.3 (H) 02/24/2020    Assessment & Plan    1. CAD/h/o NSTEMI: No significant CAD per cath, but distal LAD spasm with bradycardia noted, suspicion for SCAD.  No PCI.  Stable with no anginal symptoms. No indication for ischemic evaluation. Continue Aspirin, Plavix, lisinopril, verapamil, and Lipitor.  2. Hypertension: BP well controlled. Continue current antihypertensive regimen. Lisinopril was increased at last visit, repeat labs were never completed. Will check CMET today.    3. Hyperlipidemia: LDL was 159 in 02/2020.  Repeat lipids were scheduled in 11/24/2020 but never completed.  She is fasting today.  Will repeat lipids, CMET as above.   4. Prediabetes: Will check A1c today (was ordered in November but was never drawn). Recommend follow-up with PCP.   5. Obesity: She expresses frustration with inability to lose weight.  Will refer to healthy weight and wellness for further management.    6. Disposition: Follow-up in 6 months.   Lenna Sciara, NP 07/24/2021, 4:58 PM

## 2021-07-24 NOTE — Patient Instructions (Signed)
Medication Instructions:  Your physician recommends that you continue on your current medications as directed. Please refer to the Current Medication list given to you today.   *If you need a refill on your cardiac medications before your next appointment, please call your pharmacy*   Lab Work: Your physician recommends that you complete labs today. CMET, A1C, Lipid Panel  If you have labs (blood work) drawn today and your tests are completely normal, you will receive your results only by: MyChart Message (if you have MyChart) OR A paper copy in the mail If you have any lab test that is abnormal or we need to change your treatment, we will call you to review the results.   Testing/Procedures: NONE ordered at this time of appointment     Follow-Up: At [facility], you and your health needs are our priority.  As part of our continuing mission to provide you with exceptional heart care, we have created designated Provider Care Teams.  These Care Teams include your primary Cardiologist (physician) and Advanced Practice Providers (APPs -  Physician Assistants and Nurse Practitioners) who all work together to provide you with the care you need, when you need it.  We recommend signing up for the patient portal called "MyChart".  Sign up information is provided on this After Visit Summary.  MyChart is used to connect with patients for Virtual Visits (Telemedicine).  Patients are able to view lab/test results, encounter notes, upcoming appointments, etc.  Non-urgent messages can be sent to your provider as well.   To learn more about what you can do with MyChart, go to [email or web address].    Your next appointment:   6 month(s)  The format for your next appointment:   In Person  Provider:   Nicki Guadalajara, MD     Other Instructions   Important Information About Sugar

## 2021-07-25 LAB — COMPREHENSIVE METABOLIC PANEL
ALT: 16 IU/L (ref 0–32)
AST: 13 IU/L (ref 0–40)
Albumin/Globulin Ratio: 2 (ref 1.2–2.2)
Albumin: 4.6 g/dL (ref 3.8–4.9)
Alkaline Phosphatase: 68 IU/L (ref 44–121)
BUN/Creatinine Ratio: 16 (ref 9–23)
BUN: 10 mg/dL (ref 6–24)
Bilirubin Total: 0.3 mg/dL (ref 0.0–1.2)
CO2: 26 mmol/L (ref 20–29)
Calcium: 9.7 mg/dL (ref 8.7–10.2)
Chloride: 101 mmol/L (ref 96–106)
Creatinine, Ser: 0.61 mg/dL (ref 0.57–1.00)
Globulin, Total: 2.3 g/dL (ref 1.5–4.5)
Glucose: 105 mg/dL — ABNORMAL HIGH (ref 70–99)
Potassium: 3.9 mmol/L (ref 3.5–5.2)
Sodium: 139 mmol/L (ref 134–144)
Total Protein: 6.9 g/dL (ref 6.0–8.5)
eGFR: 107 mL/min/{1.73_m2} (ref >59)

## 2021-07-25 LAB — LIPID PANEL
Chol/HDL Ratio: 2.9 ratio (ref 0.0–4.4)
Cholesterol, Total: 138 mg/dL (ref 100–199)
HDL: 48 mg/dL (ref >39)
LDL Chol Calc (NIH): 75 mg/dL (ref 0–99)
Triglycerides: 76 mg/dL (ref 0–149)
VLDL Cholesterol Cal: 15 mg/dL (ref 5–40)

## 2021-07-25 LAB — HEMOGLOBIN A1C
Est. average glucose Bld gHb Est-mCnc: 143 mg/dL
Hgb A1c MFr Bld: 6.6 % — ABNORMAL HIGH (ref 4.8–5.6)

## 2021-07-29 ENCOUNTER — Other Ambulatory Visit: Payer: Self-pay

## 2021-07-29 ENCOUNTER — Telehealth: Payer: Self-pay

## 2021-07-29 DIAGNOSIS — Z79899 Other long term (current) drug therapy: Secondary | ICD-10-CM

## 2021-07-29 DIAGNOSIS — E7800 Pure hypercholesterolemia, unspecified: Secondary | ICD-10-CM

## 2021-07-29 MED ORDER — LISINOPRIL 10 MG PO TABS: ORAL_TABLET | ORAL | 3 refills | Status: DC

## 2021-07-29 MED ORDER — EZETIMIBE 10 MG PO TABS: 10.0000 mg | ORAL_TABLET | Freq: Every day | ORAL | 3 refills | Status: DC

## 2021-07-29 NOTE — Telephone Encounter (Signed)
Spoke with pt. Pt was notified of lab results, recommendations of starting Zetia 10 mg and repeat labs. Medication was sent to pts pharmacy and lab orders were placed. Pt voiced understanding and will follow up as planned. Pt also asked that her for a refill of Lisinopril. Medication refilled for pt.

## 2021-08-08 ENCOUNTER — Ambulatory Visit: Payer: PRIVATE HEALTH INSURANCE | Admitting: Adult Health

## 2021-08-11 ENCOUNTER — Other Ambulatory Visit: Payer: Self-pay | Admitting: Cardiology

## 2021-08-21 ENCOUNTER — Other Ambulatory Visit: Payer: Self-pay | Admitting: Cardiovascular Disease

## 2021-10-01 LAB — LIPID PANEL
Chol/HDL Ratio: 2.6 ratio (ref 0.0–4.4)
Cholesterol, Total: 117 mg/dL (ref 100–199)
HDL: 45 mg/dL (ref >39)
LDL Chol Calc (NIH): 55 mg/dL (ref 0–99)
Triglycerides: 84 mg/dL (ref 0–149)
VLDL Cholesterol Cal: 17 mg/dL (ref 5–40)

## 2021-10-27 MED ORDER — LISINOPRIL 20 MG PO TABS: 20.0000 mg | ORAL_TABLET | Freq: Every day | ORAL | 3 refills | Status: DC

## 2021-11-25 NOTE — Telephone Encounter (Signed)
Spoke with pt. Pt is aware of Bernadene Person NP recommendations and will discuss weight loss options with her PCP.

## 2022-03-29 ENCOUNTER — Other Ambulatory Visit: Payer: Self-pay | Admitting: Cardiovascular Disease

## 2022-04-22 ENCOUNTER — Other Ambulatory Visit: Payer: Self-pay | Admitting: Cardiovascular Disease

## 2022-05-02 ENCOUNTER — Other Ambulatory Visit: Payer: Self-pay | Admitting: Cardiovascular Disease

## 2022-05-04 ENCOUNTER — Other Ambulatory Visit: Payer: Self-pay

## 2022-06-04 ENCOUNTER — Other Ambulatory Visit: Payer: Self-pay | Admitting: Nurse Practitioner

## 2022-06-08 ENCOUNTER — Other Ambulatory Visit: Payer: Self-pay | Admitting: Cardiovascular Disease

## 2022-08-27 IMAGING — MR MR KNEE*R* W/O CM
7 series · 40 of 40 positions shown · non-contrast
Comparison: None.

CLINICAL DATA: Right knee pain for 4 months.

EXAM:
MRI OF THE RIGHT KNEE WITHOUT CONTRAST
TECHNIQUE: Multiplanar, multisequence MR imaging of the knee was performed. No
intravenous contrast was administered.

[Series 3: T2 fat-sat · axial · 4.0mm · 0.62mm/px · z∈[-53,+67]mm · 5 of 25 slices shown (1 of 3)]
[im 1/25]
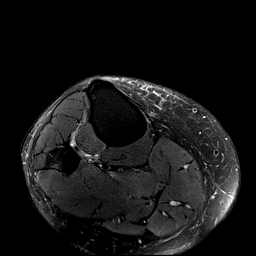
[im 7/25]
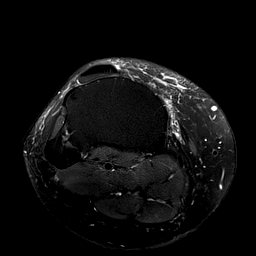
[im 13/25]
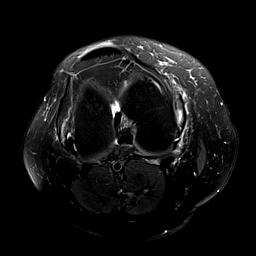
[im 19/25]
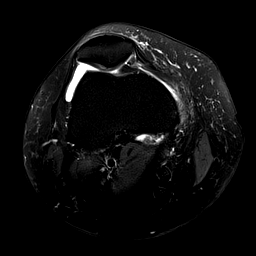
[im 25/25]
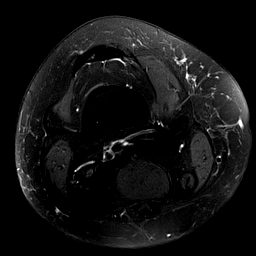

[Series 4: T1 · coronal · 4.0mm · 0.59mm/px · 6 of 25 slices shown]
[im 1/25]
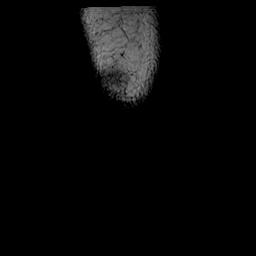
[im 5/25]
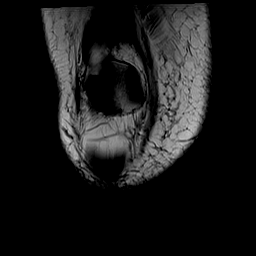
[im 10/25]
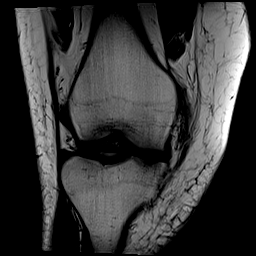
[im 15/25]
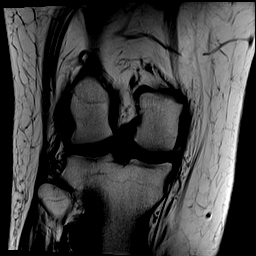
[im 20/25]
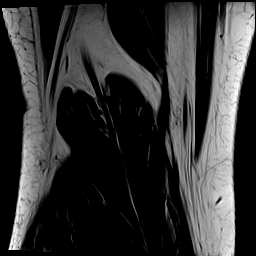
[im 25/25]
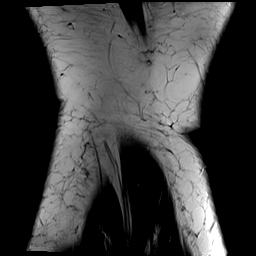

[Series 5: T2 fat-sat · coronal · 4.0mm · 0.59mm/px · 6 of 25 slices shown (2 of 3)]
[im 1/25]
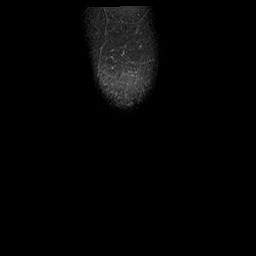
[im 5/25]
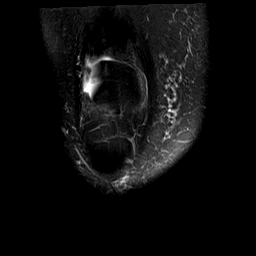
[im 10/25]
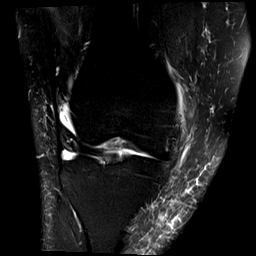
[im 15/25]
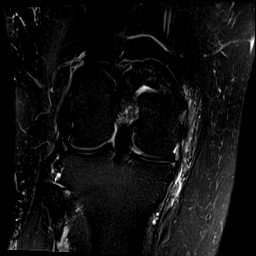
[im 20/25]
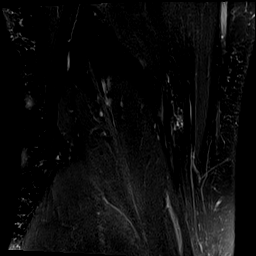
[im 25/25]
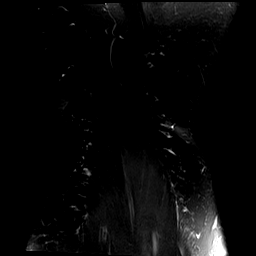

[Series 6: PD fat-sat · coronal · 3.0mm · 0.59mm/px · 7 of 30 slices shown (1 of 2)]
[im 1/30]
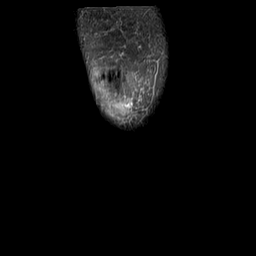
[im 5/30]
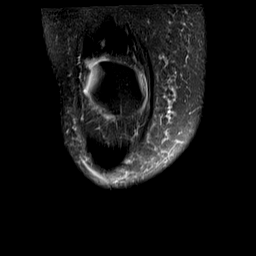
[im 10/30]
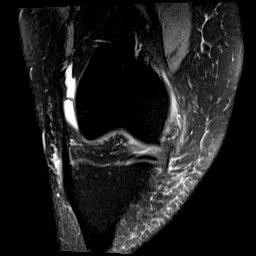
[im 15/30]
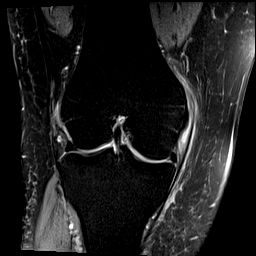
[im 20/30]
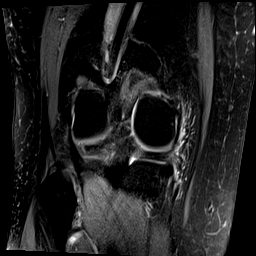
[im 25/30]
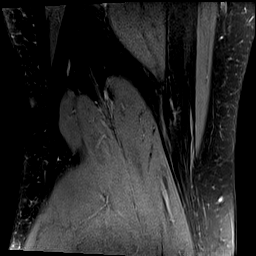
[im 30/30]
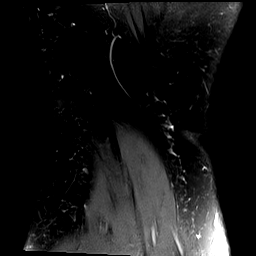

[Series 7: PD fat-sat · sagittal · 3.0mm · 0.59mm/px · 6 of 28 slices shown (2 of 2)]
[im 1/28]
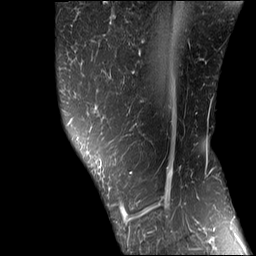
[im 6/28]
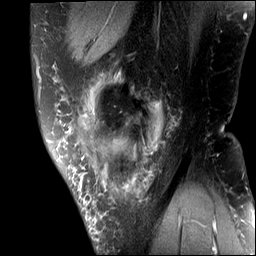
[im 11/28]
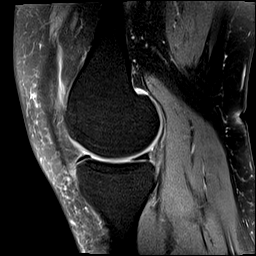
[im 17/28]
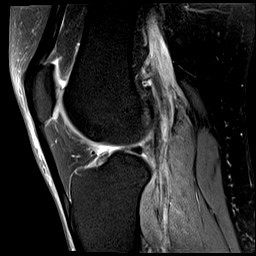
[im 22/28]
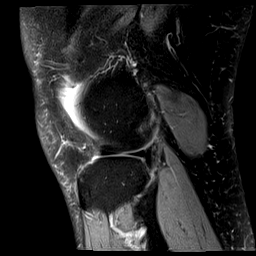
[im 28/28]
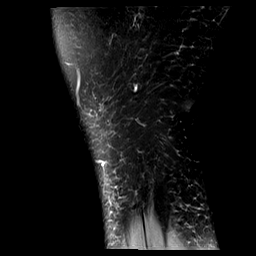

[Series 8: T2 fat-sat · sagittal · 3.0mm · 0.59mm/px · 6 of 28 slices shown (3 of 3)]
[im 1/28]
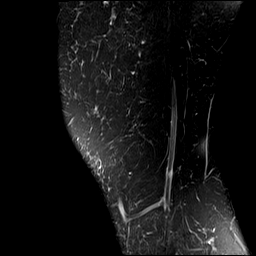
[im 6/28]
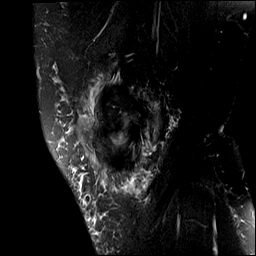
[im 11/28]
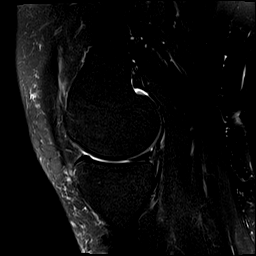
[im 17/28]
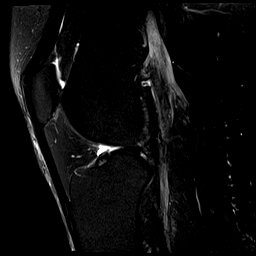
[im 22/28]
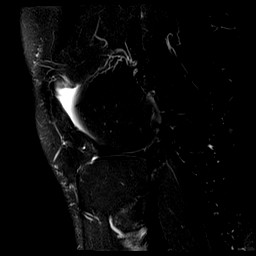
[im 28/28]
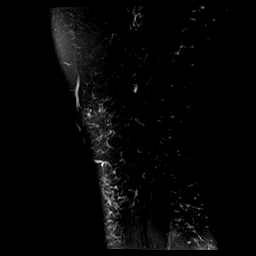

[Series 9: PD · coronal · 2.0mm · 0.47mm/px · 4 of 18 slices shown]
[im 1/18]
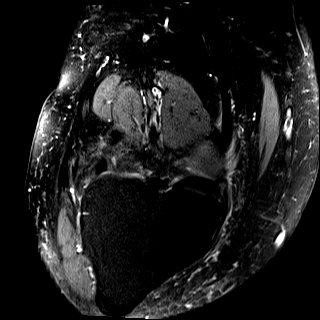
[im 6/18]
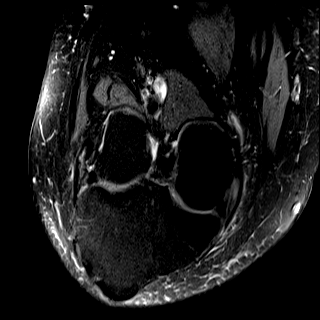
[im 12/18]
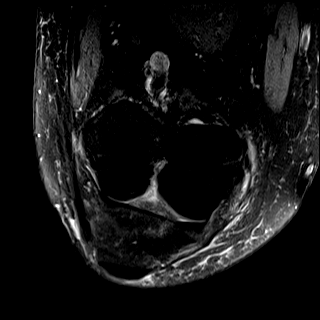
[im 18/18]
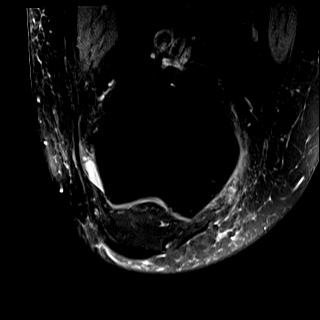

[40 of 40 positions shown; findings below may reference images not displayed]

FINDINGS: MENISCI

Medial: Tiny radial tear of the free edge of the body of the medial
meniscus.

Lateral: Tiny radial tear of the free edge of the body of the
lateral meniscus.

LIGAMENTS

Cruciates: ACL and PCL are intact.

Collaterals: Medial collateral ligament is intact. Lateral
collateral ligament complex is intact.

CARTILAGE

Patellofemoral:  Chondromalacia of the patellar apex.

Medial: Mild partial-thickness cartilage loss of the medial
femorotibial compartment.

Lateral:  No chondral defect.

JOINT: No joint effusion. Normal Dehige Iddam. No plical
thickening.

POPLITEAL FOSSA: Popliteus tendon is intact. No Baker's cyst.

EXTENSOR MECHANISM: Intact quadriceps tendon. Intact patellar
tendon. Intact lateral patellar retinaculum. Intact medial patellar
retinaculum. Intact MPFL.

BONES: No aggressive osseous lesion. No fracture or dislocation.

Other: No fluid collection or hematoma. Muscles are normal.
IMPRESSION: 1. Tiny radial tear of the free edge of the body of the medial
meniscus.
2. Tiny radial tear of the free edge of the body of the lateral
meniscus.
3. Mild partial-thickness cartilage loss of the medial femorotibial
compartment.
4. Chondromalacia of the patellar apex.

## 2022-09-02 ENCOUNTER — Other Ambulatory Visit: Payer: Self-pay | Admitting: Nurse Practitioner

## 2022-09-08 ENCOUNTER — Other Ambulatory Visit: Payer: Self-pay | Admitting: Cardiovascular Disease

## 2022-10-10 ENCOUNTER — Other Ambulatory Visit: Payer: Self-pay | Admitting: Cardiovascular Disease

## 2022-10-14 ENCOUNTER — Other Ambulatory Visit: Payer: Self-pay | Admitting: Cardiovascular Disease

## 2022-10-14 ENCOUNTER — Other Ambulatory Visit: Payer: Self-pay | Admitting: Nurse Practitioner

## 2022-10-25 ENCOUNTER — Other Ambulatory Visit: Payer: Self-pay | Admitting: Cardiovascular Disease

## 2022-11-05 ENCOUNTER — Other Ambulatory Visit: Payer: Self-pay | Admitting: Cardiovascular Disease

## 2022-11-16 ENCOUNTER — Other Ambulatory Visit: Payer: Self-pay | Admitting: Cardiovascular Disease

## 2022-11-17 ENCOUNTER — Emergency Department (HOSPITAL_BASED_OUTPATIENT_CLINIC_OR_DEPARTMENT_OTHER)
Admission: EM | Admit: 2022-11-17 | Discharge: 2022-11-17 | Disposition: A | Discharge status: Home / Self Care | Payer: BLUE CROSS/BLUE SHIELD | Attending: Emergency Medicine | Admitting: Emergency Medicine

## 2022-11-17 ENCOUNTER — Encounter (HOSPITAL_BASED_OUTPATIENT_CLINIC_OR_DEPARTMENT_OTHER): Payer: Self-pay | Admitting: Emergency Medicine

## 2022-11-17 ENCOUNTER — Other Ambulatory Visit: Payer: Self-pay

## 2022-11-17 DIAGNOSIS — B009 Herpesviral infection, unspecified: Secondary | ICD-10-CM | POA: Diagnosis not present

## 2022-11-17 DIAGNOSIS — Z7982 Long term (current) use of aspirin: Secondary | ICD-10-CM | POA: Diagnosis not present

## 2022-11-17 DIAGNOSIS — Z91040 Latex allergy status: Secondary | ICD-10-CM | POA: Diagnosis not present

## 2022-11-17 DIAGNOSIS — R21 Rash and other nonspecific skin eruption: Secondary | ICD-10-CM | POA: Diagnosis present

## 2022-11-17 MED ORDER — ACYCLOVIR 400 MG PO TABS: 400.0000 mg | ORAL_TABLET | Freq: Every day | ORAL | 0 refills | Status: DC

## 2022-11-17 NOTE — ED Triage Notes (Signed)
Right side face started itching about 3 am, now has pustules to right side of face near lips. Feels like it is burning

## 2022-11-17 NOTE — Discharge Instructions (Addendum)
Try taking a dose of imodium for diarrhea.  Eat Banana's rice, applesauce and toast to help decreased diarrhea.  Yogurt may help as well.  Try taking a probiotic

## 2022-11-17 NOTE — ED Provider Notes (Signed)
[place] EMERGENCY DEPARTMENT AT [facility] Provider Note   CSN: [id number] Arrival date & time: 11/17/22  1656     History  No chief complaint on file.   Cassidy Sanchez is a 52 y.o. female.  Patient complains of a rash on the right side of her face near her mouth.  Patient saw her primary care provider and is on Valtrex.  Patient reports that the Valtrex causes her to have diarrhea.  Patient is requesting medication other than Valtrex.  Patient denies any fever or chills she has had multiple outbreaks in the same area in the past.  Patient denies any fever or chills.        Home Medications Prior to Admission medications   Medication Sig Start Date End Date Taking? Authorizing Provider  acyclovir (ZOVIRAX) 400 MG tablet Take 1 tablet (400 mg total) by mouth 5 (five) times daily. 11/17/22  Yes Elson Areas, PA-C  acetaminophen (TYLENOL) 500 MG tablet Take 1,000 mg by mouth every 6 (six) hours as needed for headache or mild pain.    [provider]  amitriptyline (ELAVIL) 10 MG tablet Take 10 mg by mouth at bedtime as needed for sleep.    [provider]  aspirin 81 MG chewable tablet Chew 1 tablet (81 mg total) by mouth daily. 02/25/20   Leone Brand, NP  atorvastatin (LIPITOR) 80 MG tablet Take 1 tablet (80 mg total) by mouth daily. 04/22/22   Lennette Bihari, MD  buPROPion (WELLBUTRIN XL) 150 MG 24 hr tablet Take 150 mg by mouth daily.    [provider]  clopidogrel (PLAVIX) 75 MG tablet TAKE 1 TABLET BY MOUTH EVERY DAY 11/05/22   Lennette Bihari, MD  ezetimibe (ZETIA) 10 MG tablet Take 1 tablet (10 mg total) by mouth daily. 07/29/21   Joylene Grapes, NP  lisinopril (ZESTRIL) 10 MG tablet TAKE 1 TABLET(10 MG) BY MOUTH DAILY Please call office to schedule an appt for further refills. Thank you 10/16/22   Lennette Bihari, MD  lisinopril (ZESTRIL) 20 MG tablet Take 1 tablet (20 mg total) by mouth daily. 10/27/21   Duke, Roe Rutherford,  PA  methocarbamol (ROBAXIN) 500 MG tablet Take 1 tablet (500 mg total) by mouth every 8 (eight) hours as needed. 12/13/20   Magnant, Charles L, PA-C  nitroGLYCERIN (NITROSTAT) 0.4 MG SL tablet Place 1 tablet (0.4 mg total) under the tongue every 5 (five) minutes as needed for chest pain. 02/24/20   Leone Brand, NP  oxyCODONE (ROXICODONE) 5 MG immediate release tablet Take 1 tablet (5 mg total) by mouth every 6 (six) hours as needed for severe pain. 12/13/20   Magnant, Joycie Peek, PA-C  valACYclovir (VALTREX) 500 MG tablet Take 500 mg by mouth 3 (three) times daily as needed (flare-up). 09/13/14   [provider]  verapamil (CALAN-SR) 240 MG CR tablet Take 1 tablet (240 mg total) by mouth daily. KEEP OV. 11/16/22   Lennette Bihari, MD      Allergies    Latex, Erythromycin, Sulfa antibiotics, Adhesive [tape], and Codeine    Review of Systems   Review of Systems  All other systems reviewed and are negative.   Physical Exam Updated Vital Signs BP (!) 149/102   Pulse 80   Temp 98 F (36.7 C) (Oral)   Resp 20   SpO2 100%  Physical Exam Vitals reviewed.  Constitutional:      Appearance: Normal appearance.  Cardiovascular:  Rate and Rhythm: Normal rate.  Pulmonary:     Effort: Pulmonary effort is normal.  Skin:    Comments: 2cm area blistering right face,  looks herpetic   Neurological:     General: No focal deficit present.     Mental Status: She is alert.  Psychiatric:        Mood and Affect: Mood normal.     ED Results / Procedures / Treatments   Labs (all labs ordered are listed, but only abnormal results are displayed) Labs Reviewed - No data to display  EKG None  Radiology No results found.  Procedures Procedures    Medications Ordered in ED Medications - No data to display  ED Course/ Medical Decision Making/ A&P                                 Medical Decision Making Patient complains of an outbreak of herpes to the right side of her face.   Patient has been taking Valtrex but it causes area  Risk Prescription drug management. Risk Details: Patient given a prescription for acyclovir as this is a lower dose.  Patient is advised to try taking a dose of Imodium.  Patient is counseled on brat diet.  Patient is advised to follow-up with her primary care physician for recheck.           Final Clinical Impression(s) / ED Diagnoses Final diagnoses:  Herpes simplex    Rx / DC Orders ED Discharge Orders          Ordered    acyclovir (ZOVIRAX) 400 MG tablet  5 times daily        11/17/22 1729           An After Visit Summary was printed and given to the patient.    Elson Areas, Cordelia Poche 11/17/22 Aldona Lento    Benjiman Core, MD 11/19/22 1002

## 2022-11-17 NOTE — ED Notes (Signed)
Dc instructions reviewed with patient. Patient voiced understanding. Dc with belongings.  °

## 2022-11-24 ENCOUNTER — Encounter: Payer: Self-pay | Admitting: Cardiovascular Disease

## 2022-11-24 ENCOUNTER — Ambulatory Visit: Payer: BLUE CROSS/BLUE SHIELD | Attending: Cardiovascular Disease | Admitting: Cardiovascular Disease

## 2022-11-24 VITALS — BP 124/82 | HR 85 | Ht 65.0 in | Wt 227.8 lb

## 2022-11-24 DIAGNOSIS — I214 Non-ST elevation (NSTEMI) myocardial infarction: Secondary | ICD-10-CM

## 2022-11-24 DIAGNOSIS — I2510 Atherosclerotic heart disease of native coronary artery without angina pectoris: Secondary | ICD-10-CM | POA: Diagnosis not present

## 2022-11-24 DIAGNOSIS — I10 Essential (primary) hypertension: Secondary | ICD-10-CM | POA: Diagnosis not present

## 2022-11-24 DIAGNOSIS — E785 Hyperlipidemia, unspecified: Secondary | ICD-10-CM

## 2022-11-24 DIAGNOSIS — I471 Supraventricular tachycardia, unspecified: Secondary | ICD-10-CM | POA: Diagnosis not present

## 2022-11-24 DIAGNOSIS — E66812 Obesity, class 2: Secondary | ICD-10-CM

## 2022-11-24 DIAGNOSIS — E7800 Pure hypercholesterolemia, unspecified: Secondary | ICD-10-CM

## 2022-11-24 NOTE — Patient Instructions (Signed)
Medication Instructions:  STOP ASPIRIN 81MG  *If you need a refill on your cardiac medications before your next appointment, please call your pharmacy*   Lab Work: NONE If you have labs (blood work) drawn today and your tests are completely normal, you will receive your results only by: MyChart Message (if you have MyChart) OR A paper copy in the mail If you have any lab test that is abnormal or we need to change your treatment, we will call you to review the results.   Testing/Procedures: NONE   Follow-Up: At [facility], you and your health needs are our priority.  As part of our continuing mission to provide you with exceptional heart care, we have created designated Provider Care Teams.  These Care Teams include your primary Cardiologist (physician) and Advanced Practice Providers (APPs -  Physician Assistants and Nurse Practitioners) who all work together to provide you with the care you need, when you need it.  We recommend signing up for the patient portal called "MyChart".  Sign up information is provided on this After Visit Summary.  MyChart is used to connect with patients for Virtual Visits (Telemedicine).  Patients are able to view lab/test results, encounter notes, upcoming appointments, etc.  Non-urgent messages can be sent to your provider as well.   To learn more about what you can do with MyChart, go to [email or web address].    Your next appointment:   1 year(s)  Provider:   Bernadene Person

## 2022-11-24 NOTE — Progress Notes (Unsigned)
Done       Cardiology Office Note    Date:  12/02/2022   ID:  NADEEN Sanchez, DOB 26-Jan-1970, MRN [id number]  PCP:  Burnis Medin, PA-C  Cardiologist:  Nicki Guadalajara, MD   Initial office visit with me   History of Present Illness:  Cassidy ZEPEDA is a 52 y.o. female who has a remote history of SVT in 2011 and history of hypertension and hyperlipidemia a on lisinopril and simvastatin.  She presented to [facility] after developing chest pain on February 22, 2020.  She was transferred to [facility] and ultimately ruled in for non-STEMI with troponin increasing from 119 > 1617 > 2377.  He underwent cardiac catheterization which revealed no evidence for significant coronary atherosclerotic disease but there was a suggestion of very distal apical LAD pruning raising concern for vasospasm versus potential distal SCID.  She had a normal left circumflex and RCA.  EF was 55 to 60%, LVEDP 18 mm.  She was placed on aspirin and Plavix and started on verapamil in the setting of her SVT history and possible vasospasm.  Lisinopril was resumed and simvastatin was changed to atorvastatin 80 mg.  Subsequently, she has been seen in the clinic on November 22, 2020 and was stable from a cardiac standpoint.  At that time she was cleared for knee surgery.  She was last seen in the office on July 24, 2021 by Bernadene Person, NP and remained stable on DAPT, lisinopril, verapamil, and atorvastatin.  She has had continued issues with obesity.  She presents to the office today for her initial evaluation with me.  Currently she remains asymptomatic.  She walks approximately 2-1/2 miles a day.  There is no chest pain with exertion.  Several weeks ago she had a nosebleed and has continued to be on DAPT with aspirin/clopidogrel.  She is on lisinopril 20 mg, verapamil 240 mg daily, Zetia 10 mg and atorvastatin 80 mg.  She presents for evaluation.   Past Medical History:  Diagnosis Date   Family  history of anesthesia complication    mom nausea and vomited;confusion   Headache(784.0)    sinus   History of shingles    Hyperlipidemia    Hypertension    Joint swelling    right ankle   Leukocytosis 02/24/2020   PONV (postoperative nausea and vomiting)    S/P cardiac cath 02/23/20 with non obstructive disease but suggestion of vasopsasm vs potential distal SCAD 02/24/2020   Seasonal allergies    takes Zyrtec D as needed   SVT (supraventricular tachycardia) (HCC)    takes Lopressor daily    Past Surgical History:  Procedure Laterality Date   ANKLE SURGERY Right    CESAREAN SECTION     CHOLECYSTECTOMY N/A 04/13/2013   Procedure: LAPAROSCOPIC CHOLECYSTECTOMY;  Surgeon: Atilano Ina, MD;  Location: [facility] OR;  Service: General;  Laterality: N/A;   CHOLECYSTECTOMY     KNEE ARTHROSCOPY Right 12/05/2020   Procedure: RIGHT KNEE ARTHROSCOPY, DEBRIDEMENT;  Surgeon: Cammy Copa, MD;  Location: [facility] OR;  Service: Orthopedics;  Laterality: Right;   LEFT HEART CATH AND CORONARY ANGIOGRAPHY N/A 02/23/2020   Procedure: LEFT HEART CATH AND CORONARY ANGIOGRAPHY;  Surgeon: Lennette Bihari, MD;  Location: [facility] INVASIVE CV LAB;  Service: Cardiovascular;  Laterality: N/A;   NASAL SEPTUM SURGERY     TONSILLECTOMY     TUBAL LIGATION     uvula removed     VAGINAL HYSTERECTOMY  Current Medications: Outpatient Medications Prior to Visit  Medication Sig Dispense Refill   acetaminophen (TYLENOL) 500 MG tablet Take 1,000 mg by mouth every 6 (six) hours as needed for headache or mild pain.     amitriptyline (ELAVIL) 10 MG tablet Take 10 mg by mouth at bedtime as needed for sleep.     atorvastatin (LIPITOR) 80 MG tablet Take 1 tablet (80 mg total) by mouth daily. 90 tablet 1   ezetimibe (ZETIA) 10 MG tablet Take 1 tablet (10 mg total) by mouth daily. 30 tablet 3   lisinopril (ZESTRIL) 20 MG tablet Take 1 tablet (20 mg total) by mouth daily. 90 tablet 3   nitroGLYCERIN (NITROSTAT) 0.4 MG SL tablet Place  1 tablet (0.4 mg total) under the tongue every 5 (five) minutes as needed for chest pain. 25 tablet 4   valACYclovir (VALTREX) 500 MG tablet Take 500 mg by mouth 3 (three) times daily as needed (flare-up).     verapamil (CALAN-SR) 240 MG CR tablet Take 1 tablet (240 mg total) by mouth daily. KEEP OV. 30 tablet 0   acyclovir (ZOVIRAX) 400 MG tablet Take 1 tablet (400 mg total) by mouth 5 (five) times daily. 35 tablet 0   aspirin 81 MG chewable tablet Chew 1 tablet (81 mg total) by mouth daily.     buPROPion (WELLBUTRIN XL) 150 MG 24 hr tablet Take 150 mg by mouth daily.     clopidogrel (PLAVIX) 75 MG tablet TAKE 1 TABLET BY MOUTH EVERY DAY 30 tablet 0   methocarbamol (ROBAXIN) 500 MG tablet Take 1 tablet (500 mg total) by mouth every 8 (eight) hours as needed. 30 tablet 0   lisinopril (ZESTRIL) 10 MG tablet TAKE 1 TABLET(10 MG) BY MOUTH DAILY Please call office to schedule an appt for further refills. Thank you (Patient not taking: Reported on 11/24/2022) 15 tablet 0   oxyCODONE (ROXICODONE) 5 MG immediate release tablet Take 1 tablet (5 mg total) by mouth every 6 (six) hours as needed for severe pain. 30 tablet 0   No facility-administered medications prior to visit.     Allergies:   Latex, Erythromycin, Sulfa antibiotics, Adhesive [tape], and Codeine   Social History   Socioeconomic History   Marital status: Single    Spouse name: Not on file   Number of children: Not on file   Years of education: Not on file   Highest education level: Not on file  Occupational History   Not on file  Tobacco Use   Smoking status: Never   Smokeless tobacco: Never  Vaping Use   Vaping status: Never Used  Substance and Sexual Activity   Alcohol use: No   Drug use: No   Sexual activity: Not on file  Other Topics Concern   Not on file  Social History Narrative   Not on file   Social Determinants of Health   Financial Resource Strain: Not on file  Food Insecurity: Not on file  Transportation  Needs: Not on file  Physical Activity: Not on file  Stress: Not on file  Social Connections: Unknown (05/24/2022)   Received [facility]   Social Network    Social Network: Not on file    She is single and has 2 children ages 67 and 46 and a 4-year-old grandchild  Family History:  The patient's family history includes Asthma in her mother; High blood pressure in her mother; Lung cancer in her father.   ROS General: Negative; No fevers,  chills, or night sweats;  HEENT: Negative; No changes in vision or hearing, sinus congestion, difficulty swallowing Pulmonary: Negative; No cough, wheezing, shortness of breath, hemoptysis Cardiovascular: Negative; No chest pain, presyncope, syncope, palpitations GI: Negative; No nausea, vomiting, diarrhea, or abdominal pain GU: Negative; No dysuria, hematuria, or difficulty voiding Musculoskeletal: Negative; no myalgias, joint pain, or weakness Hematologic/Oncology: Negative; no easy bruising, bleeding Endocrine: Negative; no heat/cold intolerance; no diabetes Neuro: Negative; no changes in balance, headaches Skin: Negative; No rashes or skin lesions Psychiatric: Negative; No behavioral problems, depression Sleep: Negative; No snoring, daytime sleepiness, hypersomnolence, bruxism, restless legs, hypnogognic hallucinations, no cataplexy Other comprehensive 14 point system review is negative.   PHYSICAL EXAM:   VS:  BP 124/82   Pulse 85   Ht 5\' 5"  (1.651 m)   Wt 227 lb 12.8 oz (103.3 kg)   SpO2 99%   BMI 37.91 kg/m     Repeat blood pressure by me was 124/78.  Wt Readings from Last 3 Encounters:  11/24/22 227 lb 12.8 oz (103.3 kg)  07/24/21 239 lb 6.4 oz (108.6 kg)  12/03/20 213 lb (96.6 kg)    General: Alert, oriented, no distress.  Skin: normal turgor, no rashes, warm and dry HEENT: Normocephalic, atraumatic. Pupils equal round and reactive to light; sclera anicteric; extraocular muscles intact;  Nose without nasal  septal hypertrophy Mouth/Parynx benign; Mallinpatti scale 3 Neck: No JVD, no carotid bruits; normal carotid upstroke Lungs: clear to ausculatation and percussion; no wheezing or rales Chest wall: without tenderness to palpitation Heart: PMI not displaced, RRR, s1 s2 normal, 1/6 systolic murmur, no diastolic murmur, no rubs, gallops, thrills, or heaves Abdomen: soft, nontender; no hepatosplenomehaly, BS+; abdominal aorta nontender and not dilated by palpation. Back: no CVA tenderness Pulses 2+ Musculoskeletal: full range of motion, normal strength, no joint deformities Extremities: no clubbing cyanosis or edema, Homan's sign negative  Neurologic: grossly nonfocal; Cranial nerves grossly wnl Psychologic: Normal mood and affect   Studies/Labs Reviewed:   EKG Interpretation Date/Time:  Tuesday November 24 2022 08:33:31 EST Ventricular Rate:  85 PR Interval:  166 QRS Duration:  74 QT Interval:  384 QTC Calculation: 456 R Axis:   57  Text Interpretation: Normal sinus rhythm Normal ECG When compared with ECG of 24-Feb-2020 03:46, No significant change was found Confirmed by Nicki Guadalajara ([id number]) on 11/24/2022 9:16:51 AM    Recent Labs:    Latest Ref Rng & Units 07/24/2021    4:46 PM 12/05/2020    2:39 PM 02/24/2020    2:58 AM  BMP  Glucose 70 - 99 mg/dL 191  478  295   BUN 6 - 24 mg/dL 10  7  <5   Creatinine 0.57 - 1.00 mg/dL 6.21  3.08  6.57   BUN/Creat Ratio 9 - 23 16     Sodium 134 - 144 mmol/L 139  141  140   Potassium 3.5 - 5.2 mmol/L 3.9  3.0  3.8   Chloride 96 - 106 mmol/L 101  105  105   CO2 20 - 29 mmol/L 26  27  29    Calcium 8.7 - 10.2 mg/dL 9.7  9.5  8.8         Latest Ref Rng & Units 07/24/2021    4:46 PM 02/17/2013    1:00 PM 02/16/2013    9:25 PM  Hepatic Function  Total Protein 6.0 - 8.5 g/dL 6.9  8.2  8.3   Albumin 3.8 - 4.9 g/dL 4.6  4.1  4.2  AST 0 - 40 IU/L 13  16  14    ALT 0 - 32 IU/L 16  16  15    Alk Phosphatase 44 - 121 IU/L 68  77  75   Total  Bilirubin 0.0 - 1.2 mg/dL 0.3  0.3  0.3        Latest Ref Rng & Units 12/05/2020    2:39 PM 02/24/2020    2:58 AM 02/23/2020    1:52 AM  CBC  WBC 4.0 - 10.5 K/uL 7.8  10.3  16.5   Hemoglobin 12.0 - 15.0 g/dL 81.1  91.4  78.2   Hematocrit 36.0 - 46.0 % 40.2  36.6  38.5   Platelets 150 - 400 K/uL 337  341  370    Lab Results  Component Value Date   MCV 91.6 12/05/2020   MCV 92.2 02/24/2020   MCV 87.7 02/23/2020   Lab Results  Component Value Date   TSH 0.859 09/11/2009   Lab Results  Component Value Date   HGBA1C 6.6 (H) 07/24/2021     BNP No results found for: "BNP"  ProBNP No results found for: "PROBNP"   Lipid Panel     Component Value Date/Time   CHOL 117 09/24/2021 0935   TRIG 84 09/24/2021 0935   HDL 45 09/24/2021 0935   CHOLHDL 2.6 09/24/2021 0935   CHOLHDL 5.8 Ratio 10/03/2009 2130   VLDL 28 10/03/2009 2130   LDLCALC 55 09/24/2021 0935   LABVLDL 17 09/24/2021 0935     RADIOLOGY: No results found.   Additional studies/ records that were reviewed today include:  I reviewed records of her February 2022 hospitalization, catheterization report, and subsequent office records  CATH: 02/23/2020 Dist LAD lesion is 95% stenosed. The left ventricular systolic function is normal. LV end diastolic pressure is normal. The left ventricular ejection fraction is 55-65% by visual estimate.   No evidence for significant coronary atherosclerotic disease but there is a suggestion of very distal apical LAD pruning raising concern for vasospasm versus potential distal SCAD.  Normal LAD and dominant RCA vessels.   Normal LV function with EF estimated at 55 to 60% and LVEDP 18 mm.   RECOMMENDATION: We will review with colleagues.  High-sensitivity troponin enzymes had increased to 2377 prior to the procedure.  This most likely is due to the apical LAD vessel pruning which may be contributed by vasospasm versus SCAD. The patient had significant spasm of her radial and  brachial arteries during the procedure which did not respond to increasing doses of verapamil and intra-arterial NTG necessitating transition from the radial to the femoral approach.  Will obtain 2D echo Doppler study.  With positive troponins we will initiate clopidogril/aspirin.  We will also initiate verapamil with the patient's history of SVT which will be helpful both for blood pressure as well as potential coronary vasospasm.      Coronary Diagrams  Diagnostic Dominance: Right    ECHO: 02/23/2020  1. Left ventricular ejection fraction, by estimation, is 65 to 70%. The  left ventricle has normal function. The left ventricle has no regional  wall motion abnormalities. There is mild left ventricular hypertrophy.  Left ventricular diastolic parameters  are indeterminate.   2. Right ventricular systolic function is normal. The right ventricular  size is moderately enlarged.   3. The mitral valve is normal in structure. No evidence of mitral valve  regurgitation. No evidence of mitral stenosis.   4. The aortic valve is normal in structure. Aortic valve regurgitation  is  not visualized. No aortic stenosis is present.   5. The inferior vena cava is normal in size with greater than 50%  respiratory variability, suggesting right atrial pressure of 3 mmHg.     ASSESSMENT:    1. NSTEMI (non-ST elevated myocardial infarction) (HCC): 02/22/2020   2. Essential hypertension   3. Paroxysmal SVT (supraventricular tachycardia) (HCC)   4. CAD in native artery   5. Hyperlipidemia LDL goal <70   6. Obesity, Class II, BMI 35-39.9    PLAN:   Cassidy Sanchez is a 52 year old female who has a remote history of SVT in 2011 in February 2022 developed new onset chest pain and ultimately ruled in for non-STEMI.  She was found to have a very distal apical LAD with vessel pruning raising concern for vasospasm versus potential distal as CAD.  An echo Doppler study has shown normal LV function following  her event.  With her hypertensive history as well as history of SVT she was started on verapamil and continues to be on lisinopril for blood pressure control.  She has been on DAPT with aspirin/clopidogrel and has been on aggressive lipid-lowering therapy with Zetia in combination with atorvastatin 80 mg daily.  She is now 2-1/2 years following her initial event.  She recently has had issues with nosebleeding several weeks ago.  Consequently, I have recommended that she can discontinue aspirin but I we will continue her on clopidogrel 75 mg daily.  She has not had recent fasting laboratory and I will check these today including a comprehensive metabolic panel, CBC, TSH, lipid panel and LP(a).  She has been seen by Bernadene Person, NP since her initial hospitalization.  I will schedule her for 1 year follow-up evaluation with Bernadene Person in 1 year or sooner as needed.  Medication Adjustments/Labs and Tests Ordered: Current medicines are reviewed at length with the patient today.  Concerns regarding medicines are outlined above.  Medication changes, Labs and Tests ordered today are listed in the Patient Instructions below. Patient Instructions  Medication Instructions:  STOP ASPIRIN 81MG  *If you need a refill on your cardiac medications before your next appointment, please call your pharmacy*   Lab Work: NONE If you have labs (blood work) drawn today and your tests are completely normal, you will receive your results only by: MyChart Message (if you have MyChart) OR A paper copy in the mail If you have any lab test that is abnormal or we need to change your treatment, we will call you to review the results.   Testing/Procedures: NONE   Follow-Up: At [facility], you and your health needs are our priority.  As part of our continuing mission to provide you with exceptional heart care, we have created designated Provider Care Teams.  These Care Teams include your primary Cardiologist  (physician) and Advanced Practice Providers (APPs -  Physician Assistants and Nurse Practitioners) who all work together to provide you with the care you need, when you need it.  We recommend signing up for the patient portal called "MyChart".  Sign up information is provided on this After Visit Summary.  MyChart is used to connect with patients for Virtual Visits (Telemedicine).  Patients are able to view lab/test results, encounter notes, upcoming appointments, etc.  Non-urgent messages can be sent to your provider as well.   To learn more about what you can do with MyChart, go to [email or web address].    Your next appointment:   1 year(s)  Provider:  EMILY MONGE       Signed, Nicki Guadalajara, MD  12/02/2022 8:29 AM    [facility] [address], [address], [place], [place]  [address] Phone: [phone]

## 2022-11-26 ENCOUNTER — Encounter: Payer: Self-pay | Admitting: Cardiovascular Disease

## 2022-11-30 ENCOUNTER — Other Ambulatory Visit: Payer: Self-pay | Admitting: Cardiovascular Disease

## 2022-12-02 ENCOUNTER — Encounter: Payer: Self-pay | Admitting: Cardiovascular Disease

## 2022-12-12 ENCOUNTER — Other Ambulatory Visit: Payer: Self-pay | Admitting: Cardiovascular Disease

## 2023-01-10 ENCOUNTER — Encounter: Payer: Self-pay | Admitting: Cardiovascular Disease

## 2023-01-21 ENCOUNTER — Other Ambulatory Visit: Payer: Self-pay

## 2023-01-21 MED ORDER — LISINOPRIL 20 MG PO TABS: 20.0000 mg | ORAL_TABLET | Freq: Every day | ORAL | 3 refills | Status: AC

## 2023-02-09 ENCOUNTER — Other Ambulatory Visit: Payer: Self-pay | Admitting: Cardiovascular Disease

## 2023-06-12 ENCOUNTER — Other Ambulatory Visit: Payer: Self-pay | Admitting: Cardiovascular Disease

## 2023-11-22 ENCOUNTER — Emergency Department (HOSPITAL_BASED_OUTPATIENT_CLINIC_OR_DEPARTMENT_OTHER)

## 2023-11-22 ENCOUNTER — Other Ambulatory Visit: Payer: Self-pay

## 2023-11-22 ENCOUNTER — Encounter (HOSPITAL_BASED_OUTPATIENT_CLINIC_OR_DEPARTMENT_OTHER): Payer: Self-pay

## 2023-11-22 ENCOUNTER — Emergency Department (HOSPITAL_BASED_OUTPATIENT_CLINIC_OR_DEPARTMENT_OTHER)
Admission: EM | Admit: 2023-11-22 | Discharge: 2023-11-22 | Disposition: A | Discharge status: Home / Self Care | Attending: Emergency Medicine | Admitting: Emergency Medicine

## 2023-11-22 DIAGNOSIS — Z91040 Latex allergy status: Secondary | ICD-10-CM | POA: Diagnosis not present

## 2023-11-22 DIAGNOSIS — R059 Cough, unspecified: Secondary | ICD-10-CM | POA: Diagnosis present

## 2023-11-22 DIAGNOSIS — J0100 Acute maxillary sinusitis, unspecified: Secondary | ICD-10-CM | POA: Insufficient documentation

## 2023-11-22 LAB — RESP PANEL BY RT-PCR (RSV, FLU A&B, COVID)  RVPGX2
Influenza A by PCR: NEGATIVE
Influenza B by PCR: NEGATIVE
Resp Syncytial Virus by PCR: NEGATIVE
SARS Coronavirus 2 by RT PCR: NEGATIVE

## 2023-11-22 LAB — TROPONIN T, HIGH SENSITIVITY: Troponin T High Sensitivity: <15 ng/L (ref 0–19)

## 2023-11-22 LAB — CBC
HCT: 39.5 % (ref 36.0–46.0)
Hemoglobin: 13.1 g/dL (ref 12.0–15.0)
MCH: 29.4 pg (ref 26.0–34.0)
MCHC: 33.2 g/dL (ref 30.0–36.0)
MCV: 88.6 fL (ref 80.0–100.0)
Platelets: 383 K/uL (ref 150–400)
RBC: 4.46 MIL/uL (ref 3.87–5.11)
RDW: 12.1 % (ref 11.5–15.5)
WBC: 8.2 K/uL (ref 4.0–10.5)
nRBC: 0 % (ref 0.0–0.2)

## 2023-11-22 LAB — BASIC METABOLIC PANEL WITH GFR
Anion gap: 11 (ref 5–15)
BUN: 10 mg/dL (ref 6–20)
CO2: 26 mmol/L (ref 22–32)
Calcium: 9.8 mg/dL (ref 8.9–10.3)
Chloride: 102 mmol/L (ref 98–111)
Creatinine, Ser: 0.54 mg/dL (ref 0.44–1.00)
GFR, Estimated: >60 mL/min (ref >60)
Glucose, Bld: 113 mg/dL — ABNORMAL HIGH (ref 70–99)
Potassium: 3.7 mmol/L (ref 3.5–5.1)
Sodium: 139 mmol/L (ref 135–145)

## 2023-11-22 MED ORDER — PREDNISONE 50 MG PO TABS: 50.0000 mg | ORAL_TABLET | Freq: Every day | ORAL | 0 refills | Status: AC

## 2023-11-22 MED ORDER — IOHEXOL 350 MG/ML SOLN
100.0000 mL | Freq: Once | INTRAVENOUS | Status: AC | PRN
  Administered 2023-11-22: 100 mL via INTRAVENOUS

## 2023-11-22 MED ORDER — AMOXICILLIN 500 MG PO CAPS
500.0000 mg | ORAL_CAPSULE | Freq: Once | ORAL | Status: AC
Start: 2023-11-22 — End: 2023-11-22
  Administered 2023-11-22: 500 mg via ORAL
  Filled 2023-11-22: qty 1

## 2023-11-22 MED ORDER — AMOXICILLIN 500 MG PO CAPS: 500.0000 mg | ORAL_CAPSULE | Freq: Two times a day (BID) | ORAL | 0 refills | Status: AC

## 2023-11-22 MED ORDER — PREDNISONE 50 MG PO TABS
50.0000 mg | ORAL_TABLET | Freq: Once | ORAL | Status: AC
  Administered 2023-11-22: 50 mg via ORAL
  Filled 2023-11-22: qty 1

## 2023-11-22 MED ORDER — FLUTICASONE PROPIONATE 50 MCG/ACT NA SUSP: 2.0000 | Freq: Every day | NASAL | 0 refills | Status: DC

## 2023-11-22 NOTE — ED Provider Notes (Signed)
 [place] [facility] Provider Note   CSN: [id number] Arrival date & time: 11/22/23  1626     Patient presents with: Chest Pain   Cassidy Sanchez is a 53 y.o. female here for evaluation of cough chest pain congestion.  Symptoms initially started about 2 weeks ago.  Was seen via telehealth visit thought likely viral illness.  Patient states she has been coughing so much she is now having anterior chest wall pain on the right.  She feels short of breath when she coughs.  She states she is still having yellow-green congestion as well as productive cough of yellow sputum.  No hemoptysis.  No pain or swelling to lower legs.  No history of PE or DVT.  Has a history of NSTEMI and SVT.  No fever.  Taking Coricidin HBP at home and [employer].  No PND orthopnea.   HPI     Prior to Admission medications   Medication Sig Start Date End Date Taking? Authorizing Provider  amoxicillin  (AMOXIL ) 500 MG capsule Take 1 capsule (500 mg total) by mouth 2 (two) times daily for 7 days. 11/22/23 11/29/23 Yes Jazalynn Mireles A, PA-C  fluticasone (FLONASE) 50 MCG/ACT nasal spray Place 2 sprays into both nostrils daily. 11/22/23  Yes Raseel Jans A, PA-C  predniSONE (DELTASONE) 50 MG tablet Take 1 tablet (50 mg total) by mouth daily for 4 days. 11/22/23 11/26/23 Yes Mahrosh Donnell A, PA-C  acetaminophen  (TYLENOL ) 500 MG tablet Take 1,000 mg by mouth every 6 (six) hours as needed for headache or mild pain.    [provider]  amitriptyline  (ELAVIL ) 10 MG tablet Take 10 mg by mouth at bedtime as needed for sleep.    [provider]  atorvastatin  (LIPITOR ) 80 MG tablet TAKE 1 TABLET BY MOUTH EVERY DAY 02/09/23   Burnard Debby LABOR, MD  clopidogrel  (PLAVIX ) 75 MG tablet TAKE 1 TABLET BY MOUTH EVERY DAY 06/14/23   Burnard Debby LABOR, MD  ezetimibe  (ZETIA ) 10 MG tablet Take 1 tablet (10 mg total) by mouth daily. 07/29/21   Daneen Damien BROCKS, NP  lisinopril  (ZESTRIL )  20 MG tablet Take 1 tablet (20 mg total) by mouth daily. 01/21/23   Burnard Debby LABOR, MD  nitroGLYCERIN  (NITROSTAT ) 0.4 MG SL tablet Place 1 tablet (0.4 mg total) under the tongue every 5 (five) minutes as needed for chest pain. 02/24/20   Ingold, Laura R, NP  valACYclovir (VALTREX) 500 MG tablet Take 500 mg by mouth 3 (three) times daily as needed (flare-up). 09/13/14   [provider]  verapamil  (CALAN -SR) 240 MG CR tablet Take 1 tablet (240 mg total) by mouth daily. 12/14/22   Burnard Debby LABOR, MD    Allergies: Latex, Erythromycin, Sulfa antibiotics, Adhesive [tape], and Codeine    Review of Systems  Constitutional: Negative.   HENT:  Positive for congestion, postnasal drip, rhinorrhea, sinus pressure and sore throat. Negative for trouble swallowing and voice change.   Respiratory:  Positive for cough and shortness of breath. Negative for apnea, choking, chest tightness, wheezing and stridor.   Cardiovascular:  Positive for chest pain. Negative for palpitations and leg swelling.  Gastrointestinal: Negative.   Genitourinary: Negative.   Musculoskeletal: Negative.   Skin: Negative.   Neurological: Negative.   All other systems reviewed and are negative.   Updated Vital Signs BP 116/60 (BP Location: Right Arm)   Pulse 68   Temp 98.5 F (36.9 C) (Oral)   Resp 16   Ht  5' 5 (1.651 m)   Wt 105.2 kg   SpO2 100%   BMI 38.61 kg/m   Physical Exam Vitals and nursing note reviewed.  Constitutional:      General: She is not in acute distress.    Appearance: She is well-developed. She is not ill-appearing, toxic-appearing or diaphoretic.  HENT:     Head: Atraumatic.     Jaw: There is normal jaw occlusion.     Nose: Nasal tenderness, mucosal edema, congestion and rhinorrhea present. Rhinorrhea is purulent.     Right Turbinates: Enlarged and swollen.     Left Turbinates: Enlarged and swollen.     Right Sinus: Maxillary sinus tenderness present.     Left Sinus: Maxillary sinus  tenderness present.  Eyes:     Pupils: Pupils are equal, round, and reactive to light.  Cardiovascular:     Rate and Rhythm: Normal rate.     Pulses:          Radial pulses are 2+ on the right side and 2+ on the left side.       Dorsalis pedis pulses are 2+ on the right side and 2+ on the left side.     Heart sounds: Normal heart sounds.  Pulmonary:     Effort: Pulmonary effort is normal. No respiratory distress.     Breath sounds: Normal breath sounds.  Chest:     Comments: Anterior chest wall tenderness wo crepitus Abdominal:     General: Bowel sounds are normal. There is no distension.     Palpations: Abdomen is soft. There is no mass.     Tenderness: There is no abdominal tenderness. There is no guarding or rebound.     Comments: Soft non tender wo rebound or guarding   Musculoskeletal:        General: Normal range of motion.     Cervical back: Normal range of motion.     Right lower leg: No tenderness. No edema.     Left lower leg: No tenderness. No edema.     Comments: No bony tenderness, compartments soft, full range of motion.  No lower extremity edema, calf pain  Skin:    General: Skin is warm and dry.     Comments: No obvious rashes or lesions on exposed skin  Neurological:     General: No focal deficit present.     Mental Status: She is alert.  Psychiatric:        Mood and Affect: Mood normal.     (all labs ordered are listed, but only abnormal results are displayed) Labs Reviewed  BASIC METABOLIC PANEL WITH GFR - Abnormal; Notable for the following components:      Result Value   Glucose, Bld 113 (*)    All other components within normal limits  RESP PANEL BY RT-PCR (RSV, FLU A&B, COVID)  [id number]  CBC  TROPONIN T, HIGH SENSITIVITY    EKG: EKG Interpretation Date/Time:  Monday November 22 2023 16:36:24 EST Ventricular Rate:  70 PR Interval:  146 QRS Duration:  93 QT Interval:  425 QTC Calculation: 459 R Axis:   77  Text Interpretation: Sinus rhythm  ST changes in inferior leads similar to prior EKGs Confirmed by Jerrol Agent ([id number]) on 11/22/2023 4:41:29 PM  Radiology: CT Angio Chest PE W and/or Wo Contrast Result Date: 11/22/2023 CLINICAL DATA:  High probability for PE. Cough, congestion and shortness of breath. EXAM: CT ANGIOGRAPHY CHEST WITH CONTRAST TECHNIQUE: Multidetector CT imaging of the chest was  performed using the standard protocol during bolus administration of intravenous contrast. Multiplanar CT image reconstructions and MIPs were obtained to evaluate the vascular anatomy. RADIATION DOSE REDUCTION: This exam was performed according to the departmental dose-optimization program which includes automated exposure control, adjustment of the mA and/or kV according to patient size and/or use of iterative reconstruction technique. CONTRAST:  OMNIPAQUE  IOHEXOL  350 MG/ML SOLN COMPARISON:  CT angiogram chest 09/12/2009. FINDINGS: Cardiovascular: Satisfactory opacification of the pulmonary arteries to the segmental level. No evidence of pulmonary embolism. Normal heart size. No pericardial effusion. Mediastinum/Nodes: No enlarged mediastinal, hilar, or axillary lymph nodes. Thyroid gland, trachea, and esophagus demonstrate no significant findings. Lungs/Pleura: Lungs are clear. No pleural effusion or pneumothorax. Upper Abdomen: No acute abnormality. Musculoskeletal: No chest wall abnormality. No acute or significant osseous findings. Review of the MIP images confirms the above findings. IMPRESSION: No evidence of pulmonary embolism or other acute intrathoracic process. Electronically Signed   By: Greig Pique M.D.   On: 11/22/2023 20:53   DG Chest 2 View Result Date: 11/22/2023 CLINICAL DATA:  Chest pain EXAM: CHEST - 2 VIEW COMPARISON:  Chest radiograph dated 02/23/2020 FINDINGS: Normal lung volumes. No focal consolidations. No pleural effusion or pneumothorax. The heart size and mediastinal contours are within normal limits. No acute  osseous abnormality. IMPRESSION: No active cardiopulmonary disease. Electronically Signed   By: Limin  Xu M.D.   On: 11/22/2023 17:46     Procedures   Medications Ordered in the ED  iohexol  (OMNIPAQUE ) 350 MG/ML injection 100 mL (100 mLs Intravenous Contrast Given 11/22/23 1938)  amoxicillin  (AMOXIL ) capsule 500 mg (500 mg Oral Given 11/22/23 2115)  predniSONE (DELTASONE) tablet 50 mg (50 mg Oral Given 11/22/23 8632)    53 year old here for evaluation of URI symptoms.  Initially started 2 weeks ago.  Now having anterior chest wall pain.  She does not appear grossly fluid overloaded.  No history of PE or DVT.  Does have moderate congestion and purulent rhinorrhea to her.  Will plan on labs, imaging, reassess.    Labs and imaging personally viewed and interpreted:  CBC without leukocytosis Metabolic panel glucose 113 Troponin less than 15 Chest x-ray without cardiomegaly, pulm edema, pneumothorax, infiltrate EKG with ST changes similar to prior  Patient reassessed. Shared decision making for CT imaging with patient.  Prefer CT chest.  CTA chest shows no acute abnormality  I suspect patient's cough likely postviral cough.  Chest pain reproducible on exam low suspicion for acute ACS, PE, dissection, pneumothorax, myocarditis, endocarditis, pericarditis.  She does have persistent purulent sinusitis symptoms over the last greater than 2 weeks.  Will treat as infectious.  Low suspicion for deep space infection, thrombosis, necrotizing/fungal infection as cause of her sinusitis.  Follow-up with PCP in 1 to 2 days for reevaluation, return for worsening symptoms  The patient has been appropriately medically screened and/or stabilized in the ED. I have low suspicion for any other emergent medical condition which would require further screening, evaluation or treatment in the ED or require inpatient management.  Patient is hemodynamically stable and in no acute distress.  Patient able to ambulate  in department prior to ED.  Evaluation does not show acute pathology that would require ongoing or additional emergent interventions while in the emergency department or further inpatient treatment.  I have discussed the diagnosis with the patient and answered all questions.  Pain is been managed while in the emergency department and patient has no further complaints prior to discharge.  Patient  is comfortable with plan discussed in room and is stable for discharge at this time.  I have discussed strict return precautions for returning to the emergency department.  Patient was encouraged to follow-up with PCP/specialist refer to at discharge.                                   Medical Decision Making Amount and/or Complexity of Data Reviewed External Data Reviewed: labs, radiology, ECG and notes. Labs: ordered. Decision-making details documented in ED Course. Radiology: ordered and independent interpretation performed. Decision-making details documented in ED Course. ECG/medicine tests: ordered and independent interpretation performed. Decision-making details documented in ED Course.  Risk OTC drugs. Prescription drug management. Decision regarding hospitalization. Diagnosis or treatment significantly limited by social determinants of health.       Final diagnoses:  Acute non-recurrent maxillary sinusitis    ED Discharge Orders          Ordered    amoxicillin  (AMOXIL ) 500 MG capsule  2 times daily        11/22/23 2126    fluticasone (FLONASE) 50 MCG/ACT nasal spray  Daily        11/22/23 2126    predniSONE (DELTASONE) 50 MG tablet  Daily        11/22/23 2126               Nene Aranas A, PA-C 11/22/23 2317    Jerrol Agent, MD 11/23/23 1144

## 2023-11-22 NOTE — Discharge Instructions (Signed)
 It was a pleasure taking care of you today  Start taking the medications as prescribed  Return for new or worsening symptoms

## 2023-11-22 NOTE — ED Triage Notes (Signed)
 Reports cough, congestion, chest heaviness, and SHOB w exertion   No obvious signs of respiratory distress in triage

## 2023-11-22 NOTE — ED Notes (Signed)
 Patient transferred from waiting room to ED treatment room. Assuming pt care at this time.

## 2023-12-21 ENCOUNTER — Other Ambulatory Visit: Payer: Self-pay

## 2023-12-22 MED ORDER — VERAPAMIL HCL ER 240 MG PO TBCR: 240.0000 mg | EXTENDED_RELEASE_TABLET | Freq: Every day | ORAL | 0 refills | Status: AC

## 2024-02-02 ENCOUNTER — Other Ambulatory Visit: Payer: Self-pay | Admitting: Nurse Practitioner

## 2024-02-02 ENCOUNTER — Encounter: Payer: Self-pay | Admitting: Nurse Practitioner

## 2024-02-02 ENCOUNTER — Ambulatory Visit: Attending: Nurse Practitioner | Admitting: Nurse Practitioner

## 2024-02-02 VITALS — BP 140/90 | HR 71 | Ht 64.0 in | Wt 237.0 lb

## 2024-02-02 DIAGNOSIS — I10 Essential (primary) hypertension: Secondary | ICD-10-CM | POA: Diagnosis not present

## 2024-02-02 DIAGNOSIS — R7303 Prediabetes: Secondary | ICD-10-CM | POA: Diagnosis not present

## 2024-02-02 DIAGNOSIS — I201 Angina pectoris with documented spasm: Secondary | ICD-10-CM | POA: Diagnosis not present

## 2024-02-02 DIAGNOSIS — R072 Precordial pain: Secondary | ICD-10-CM

## 2024-02-02 DIAGNOSIS — I471 Supraventricular tachycardia, unspecified: Secondary | ICD-10-CM

## 2024-02-02 DIAGNOSIS — E785 Hyperlipidemia, unspecified: Secondary | ICD-10-CM | POA: Diagnosis not present

## 2024-02-02 LAB — BASIC METABOLIC PANEL WITH GFR
BUN/Creatinine Ratio: 23 (ref 9–23)
BUN: 12 mg/dL (ref 6–24)
CO2: 24 mmol/L (ref 20–29)
Calcium: 9.5 mg/dL (ref 8.7–10.2)
Chloride: 104 mmol/L (ref 96–106)
Creatinine, Ser: 0.52 mg/dL — ABNORMAL LOW (ref 0.57–1.00)
Glucose: 98 mg/dL (ref 70–99)
Potassium: 4.3 mmol/L (ref 3.5–5.2)
Sodium: 141 mmol/L (ref 134–144)
eGFR: 110 mL/min/{1.73_m2}

## 2024-02-02 LAB — LIPID PANEL
Chol/HDL Ratio: 3.4 ratio (ref 0.0–4.4)
Cholesterol, Total: 159 mg/dL (ref 100–199)
HDL: 47 mg/dL
LDL Chol Calc (NIH): 97 mg/dL (ref 0–99)
Triglycerides: 77 mg/dL (ref 0–149)
VLDL Cholesterol Cal: 15 mg/dL (ref 5–40)

## 2024-02-02 MED ORDER — ISOSORBIDE MONONITRATE ER 30 MG PO TB24: 315.0000 mg | ORAL_TABLET | Freq: Every day | ORAL | 3 refills | Status: DC

## 2024-02-02 MED ORDER — METOPROLOL TARTRATE 100 MG PO TABS: ORAL_TABLET | ORAL | 0 refills | Status: AC

## 2024-02-02 NOTE — Progress Notes (Addendum)
 "  Office Visit    Patient Name: Cassidy Sanchez Date of Encounter: 02/02/2024  Primary Care Provider:  Boneta Ruby BRAVO, PA-C Primary Cardiologist:  Debby Sor, MD (Inactive)  Chief Complaint    54 year old female with a history of NSTEMI (no significant CAD-suspicion for SCAD, coronary vasospasm), SVT, hypertension, hyperlipidemia, and prediabetes.    Past Medical History    Past Medical History:  Diagnosis Date   Family history of anesthesia complication    mom nausea and vomited;confusion   Headache(784.0)    sinus   History of shingles    Hyperlipidemia    Hypertension    Joint swelling    right ankle   Leukocytosis 02/24/2020   PONV (postoperative nausea and vomiting)    S/P cardiac cath 02/23/20 with non obstructive disease but suggestion of vasopsasm vs potential distal SCAD 02/24/2020   Seasonal allergies    takes Zyrtec D as needed   SVT (supraventricular tachycardia)    takes Lopressor  daily   Past Surgical History:  Procedure Laterality Date   ANKLE SURGERY Right    CESAREAN SECTION     CHOLECYSTECTOMY N/A 04/13/2013   Procedure: LAPAROSCOPIC CHOLECYSTECTOMY;  Surgeon: Camellia CHRISTELLA Blush, MD;  Location: [facility] OR;  Service: General;  Laterality: N/A;   CHOLECYSTECTOMY     KNEE ARTHROSCOPY Right 12/05/2020   Procedure: RIGHT KNEE ARTHROSCOPY, DEBRIDEMENT;  Surgeon: Addie Cordella Hamilton, MD;  Location: [facility] OR;  Service: Orthopedics;  Laterality: Right;   LEFT HEART CATH AND CORONARY ANGIOGRAPHY N/A 02/23/2020   Procedure: LEFT HEART CATH AND CORONARY ANGIOGRAPHY;  Surgeon: Sor Debby LABOR, MD;  Location: [facility] INVASIVE CV LAB;  Service: Cardiovascular;  Laterality: N/A;   NASAL SEPTUM SURGERY     TONSILLECTOMY     TUBAL LIGATION     uvula removed     VAGINAL HYSTERECTOMY      Allergies  Allergies[1]   Labs/Other Studies Reviewed    The following studies were reviewed today:  Cardiac Studies & Procedures    ______________________________________________________________________________________________ CARDIAC CATHETERIZATION  CARDIAC CATHETERIZATION 02/23/2020  Conclusion  Dist LAD lesion is 95% stenosed.  The left ventricular systolic function is normal.  LV end diastolic pressure is normal.  The left ventricular ejection fraction is 55-65% by visual estimate.  No evidence for significant coronary atherosclerotic disease but there is a suggestion of very distal apical LAD pruning raising concern for vasospasm versus potential distal SCAD.  Normal LAD and dominant RCA vessels.  Normal LV function with EF estimated at 55 to 60% and LVEDP 18 mm.  RECOMMENDATION: We will review with colleagues.  High-sensitivity troponin enzymes had increased to 2377 prior to the procedure.  This most likely is due to the apical LAD vessel pruning which may be contributed by vasospasm versus SCAD. The patient had significant spasm of her radial and brachial arteries during the procedure which did not respond to increasing doses of verapamil  and intra-arterial NTG necessitating transition from the radial to the femoral approach.  Will obtain 2D echo Doppler study.  With positive troponins we will initiate clopidogril/aspirin .  We will also initiate verapamil  with the patient's history of SVT which will be helpful both for blood pressure as well as potential coronary vasospasm.  Findings Coronary Findings Diagnostic  Dominance: Right  Left Anterior Descending Dist LAD lesion is 95% stenosed.  Intervention  No interventions have been documented.     ECHOCARDIOGRAM  ECHOCARDIOGRAM COMPLETE 02/23/2020  Narrative ECHOCARDIOGRAM REPORT    Patient Name:   Cassidy Sanchez  Dick Date of Exam: 02/23/2020 Medical Rec #:  [id number]          Height: Accession #:    [id number]         Weight: Date of Birth:  January 22, 1970          BSA: Patient Age:    49 years           BP:           137/71 mmHg Patient Gender: F                   HR:           91 bpm. Exam Location:  Inpatient  Procedure: 2D Echo, Cardiac Doppler and Color Doppler  Indications:    Elevated Troponin  History:        Patient has no prior history of Echocardiogram examinations. Signs/Symptoms:Chest Pain, Risk Factors:Hypertension and Dyslipidemia.; Medications:Supraventricular tachycardia.  Sonographer:    Annabella Fell RDCS Referring Phys: [id number] BRUNO EMERSON CAMPI  IMPRESSIONS   1. Left ventricular ejection fraction, by estimation, is 65 to 70%. The left ventricle has normal function. The left ventricle has no regional wall motion abnormalities. There is mild left ventricular hypertrophy. Left ventricular diastolic parameters are indeterminate. 2. Right ventricular systolic function is normal. The right ventricular size is moderately enlarged. 3. The mitral valve is normal in structure. No evidence of mitral valve regurgitation. No evidence of mitral stenosis. 4. The aortic valve is normal in structure. Aortic valve regurgitation is not visualized. No aortic stenosis is present. 5. The inferior vena cava is normal in size with greater than 50% respiratory variability, suggesting right atrial pressure of 3 mmHg.  FINDINGS Left Ventricle: Left ventricular ejection fraction, by estimation, is 65 to 70%. The left ventricle has normal function. The left ventricle has no regional wall motion abnormalities. The left ventricular internal cavity size was normal in size. There is mild left ventricular hypertrophy. Left ventricular diastolic parameters are indeterminate.  Right Ventricle: The right ventricular size is moderately enlarged. No increase in right ventricular wall thickness. Right ventricular systolic function is normal.  Left Atrium: Left atrial size was normal in size.  Right Atrium: Right atrial size was normal in size.  Pericardium: There is no evidence of pericardial effusion.  Mitral Valve: The mitral valve is normal  in structure. No evidence of mitral valve regurgitation. No evidence of mitral valve stenosis.  Tricuspid Valve: The tricuspid valve is normal in structure. Tricuspid valve regurgitation is not demonstrated. No evidence of tricuspid stenosis.  Aortic Valve: The aortic valve is normal in structure. Aortic valve regurgitation is not visualized. No aortic stenosis is present.  Pulmonic Valve: The pulmonic valve was normal in structure. Pulmonic valve regurgitation is not visualized. No evidence of pulmonic stenosis.  Aorta: The aortic root is normal in size and structure.  Venous: The inferior vena cava is normal in size with greater than 50% respiratory variability, suggesting right atrial pressure of 3 mmHg.  IAS/Shunts: No atrial level shunt detected by color flow Doppler.  Oneil Parchment MD Electronically signed by Oneil Parchment MD Signature Date/Time: 02/23/2020/3:10:27 PM    Final          ______________________________________________________________________________________________     Recent Labs: 11/22/2023: BUN 10; Creatinine, Ser 0.54; Hemoglobin 13.1; Platelets 383; Potassium 3.7; Sodium 139  Recent Lipid Panel    Component Value Date/Time   CHOL 117 09/24/2021 0935   TRIG 84 09/24/2021 0935   HDL 45 09/24/2021 0935  CHOLHDL 2.6 09/24/2021 0935   CHOLHDL 5.8 Ratio 10/03/2009 2130   VLDL 28 10/03/2009 2130   LDLCALC 55 09/24/2021 0935    History of Present Illness    54 year old female with the above past medical history including NSTEMI (no significant CAD-suspicion for SCAD, coronary vasospasm), SVT, hypertension, hyperlipidemia, and prediabetes.    She was hospitalized in February 2022 with chest pain, nausea, and vomiting.  EKG was nonischemic but repeat EKG showed nonspecific ST abnormalities.  Cardiac catheterization showed distal LAD stenosis of 90%, normal LVEF.  There was concern for vasospasm versus potential distal SCAD.  She was started on aspirin ,  Plavix , and verapamil  in the setting of history of SVT, possible vasospasm.  She was last seen in the office on 11/24/2022 and was stable from a cardiac standpoint.  She denied symptoms concerning for angina.  She was seen in the ED in November 2025 in the setting of chest pain.  Troponin was negative.  She was treated for an upper respiratory infection.  She presents today for follow-up.  Since her last visit she has been stable from a cardiac standpoint.  However, over the past several days she has noticed intermittent chest pain, which she describes as a muscle pulling in the area just above her L breast.  Symptoms usually last for seconds and resolve spontaneously.  She did have 1 episode that lasted for a longer period of time, she took nitroglycerin  and her symptoms resolved immediately.  BP was mildly elevated at the time.  She continues to note intermittent chest discomfort.  She denies any palpitations, dizziness, presyncope, syncope, dyspnea, edema, PND, orthopnea, weight gain.  Home Medications    Current Outpatient Medications  Medication Sig Dispense Refill   acetaminophen  (TYLENOL ) 500 MG tablet Take 1,000 mg by mouth every 6 (six) hours as needed for headache or mild pain.     atorvastatin  (LIPITOR ) 80 MG tablet TAKE 1 TABLET BY MOUTH EVERY DAY 90 tablet 3   clopidogrel  (PLAVIX ) 75 MG tablet TAKE 1 TABLET BY MOUTH EVERY DAY 90 tablet 3   isosorbide  mononitrate (IMDUR ) 30 MG 24 hr tablet Take 10.5 tablets (315 mg total) by mouth daily. 45 tablet 3   lisinopril  (ZESTRIL ) 20 MG tablet Take 1 tablet (20 mg total) by mouth daily. 90 tablet 3   metoprolol  tartrate (LOPRESSOR ) 100 MG tablet Take 1 tablet 2 hours prior to cardiac ct 1 tablet 0   nitroGLYCERIN  (NITROSTAT ) 0.4 MG SL tablet Place 1 tablet (0.4 mg total) under the tongue every 5 (five) minutes as needed for chest pain. 25 tablet 4   valACYclovir (VALTREX) 500 MG tablet Take 500 mg by mouth 3 (three) times daily as needed  (flare-up).     verapamil  (CALAN -SR) 240 MG CR tablet Take 1 tablet (240 mg total) by mouth daily. 30 tablet 0   amitriptyline  (ELAVIL ) 10 MG tablet Take 10 mg by mouth at bedtime as needed for sleep. (Patient not taking: Reported on 02/02/2024)     ezetimibe  (ZETIA ) 10 MG tablet Take 1 tablet (10 mg total) by mouth daily. 30 tablet 3   fluticasone  (FLONASE ) 50 MCG/ACT nasal spray Place 2 sprays into both nostrils daily. 9.9 mL 0   No current facility-administered medications for this visit.     Review of Systems    She denies palpitations, dyspnea, pnd, orthopnea, n, v, dizziness, syncope, edema, weight gain, or early satiety. All other systems reviewed and are otherwise negative except as noted above.   Physical  Exam    VS:  BP (!) 140/90 (Cuff Size: Large)   Pulse 71   Ht 5' 4 (1.626 m)   Wt 237 lb (107.5 kg)   SpO2 99%   BMI 40.68 kg/m   GEN: Well nourished, well developed, in no acute distress. HEENT: normal. Neck: Supple, no JVD, carotid bruits, or masses. Cardiac: RRR, no murmurs, rubs, or gallops. No clubbing, cyanosis, edema.  Radials/DP/PT 2+ and equal bilaterally.  Respiratory:  Respirations regular and unlabored, clear to auscultation bilaterally. GI: Soft, nontender, nondistended, BS + x 4. MS: no deformity or atrophy. Skin: warm and dry, no rash. Neuro:  Strength and sensation are intact. Psych: Normal affect.  Accessory Clinical Findings    ECG personally reviewed by me today - EKG Interpretation Date/Time:  Wednesday February 02 2024 14:57:12 EST Ventricular Rate:  71 PR Interval:  164 QRS Duration:  86 QT Interval:  430 QTC Calculation: 467 R Axis:   68  Text Interpretation: Normal sinus rhythm Normal ECG When compared with ECG of 22-Nov-2023 16:36, PREVIOUS ECG IS PRESENT Confirmed by Daneen Perkins ([id number]) on 02/02/2024 2:59:10 PM  - no acute changes.   Lab Results  Component Value Date   WBC 8.2 11/22/2023   HGB 13.1 11/22/2023   HCT 39.5 11/22/2023    MCV 88.6 11/22/2023   PLT 383 11/22/2023   Lab Results  Component Value Date   CREATININE 0.54 11/22/2023   BUN 10 11/22/2023   NA 139 11/22/2023   K 3.7 11/22/2023   CL 102 11/22/2023   CO2 26 11/22/2023   Lab Results  Component Value Date   ALT 16 07/24/2021   AST 13 07/24/2021   ALKPHOS 68 07/24/2021   BILITOT 0.3 07/24/2021   Lab Results  Component Value Date   CHOL 117 09/24/2021   HDL 45 09/24/2021   LDLCALC 55 09/24/2021   TRIG 84 09/24/2021   CHOLHDL 2.6 09/24/2021    Lab Results  Component Value Date   HGBA1C 6.6 (H) 07/24/2021    Assessment & Plan   1. H/o NSTEMI/coronary vasospasm: No significant CAD per cath, but distal LAD spasm with bradycardia noted, suspicion for SCAD.  No PCI.  She was evaluated in the ED and on 11/2023 in the setting of chest pain, workup was unremarkable.  She reports a several day history of intermittent chest pain which she describes as a muscle pulling in the area just above her left breast.  Symptoms have typically lasted for seconds and resolve spontaneously.  She did have an episode that lasted for longer period time.  She took a nitroglycerin  and her symptoms resolved immediately.  Through shared decision making, will pursue coronary CT angiogram.  Will update echocardiogram.  Will update CMET, fasting lipid panel today.  Will start Imdur  15 mg daily.  Reviewed ED precautions. Continue Plavix , lisinopril , verapamil , Imdur  as above, Lipitor  and Zetia .  2.  PSVT: Denies any recent palpitations.  Continue verapamil .   3. Hypertension: BP mildly elevated in office today, has been generally well-controlled.  Continue to monitor BP and report we consider than 130/80 mmHg.  For now, continue current antihypertensive regimen.    4.Hyperlipidemia:  LDL was 55 in 09/2021.  No recent LDL on file.  Will update fasting lipid panel.  Continue Lipitor , Zetia .  5. Prediabetes: A1c was 6.6 in 07/2021.  Monitored and managed per PCP.  6.  Disposition: Will have her follow-up with APP or Dr. Sheena in 6 to 8 weeks (former Dr.  Kelly patient).    Damien JAYSON Braver, NP 02/02/2024, 5:05 PM          [1]  Allergies Allergen Reactions   Latex Hives and Itching   Erythromycin Hives   Sulfa Antibiotics Itching   Adhesive [Tape] Rash    Paper tape ok.    Codeine Hives and Itching   "

## 2024-02-02 NOTE — Patient Instructions (Addendum)
 Medication Instructions:  Start Imdur  15 mg daily Metoprolol  Tartrate 100 mg take 1 tablet 2 hours prior to cardiac ct  *If you need a refill on your cardiac medications before your next appointment, please call your pharmacy*  Lab Work: CMET, Lipid panel today  Testing/Procedures: Your physician has requested that you have an echocardiogram. Echocardiography is a painless test that uses sound waves to create images of your heart. It provides your doctor with information about the size and shape of your heart and how well your hearts chambers and valves are working. This procedure takes approximately one hour. There are no restrictions for this procedure. Please do NOT wear cologne, perfume, aftershave, or lotions (deodorant is allowed). Please arrive 15 minutes prior to your appointment time.  Please note: We ask at that you not bring children with you during ultrasound (echo/ vascular) testing. Due to room size and safety concerns, children are not allowed in the ultrasound rooms during exams. Our front office staff cannot provide observation of children in our lobby area while testing is being conducted. An adult accompanying a patient to their appointment will only be allowed in the ultrasound room at the discretion of the ultrasound technician under special circumstances. We apologize for any inconvenience.    Your cardiac CT will be scheduled at one of the below locations:  [facility] [address]  [place], [place] [address]  If scheduled at the Heart and Vascular Tower at [address], please enter the parking lot using the [address] entrance and use the FREE valet service at the patient drop-off area. Enter the building and check-in with registration on the main floor.   There is spacious parking and easy access to the radiology department from the [facility] entrance. Please enter here and check-in with the desk attendant.    If scheduled at [facility], please arrive 30 minutes early for check-in and test prep.  Please follow these instructions carefully (unless otherwise directed):  An IV will be required for this test and Nitroglycerin  will be given.  Hold all erectile dysfunction medications at least 3 days (72 hrs) prior to test. (Ie viagra, cialis, sildenafil, tadalafil, etc)   On the Night Before the Test: Be sure to Drink plenty of water. Do not consume any caffeinated/decaffeinated beverages or chocolate 12 hours prior to your test. Do not take any antihistamines 12 hours prior to your test.  On the Day of the Test: Drink plenty of water until 1 hour prior to the test. Do not eat any food 1 hour prior to test. You may take your regular medications prior to the test.  Take metoprolol  (Lopressor ) two hours prior to test. If you take Furosemide/Hydrochlorothiazide/Spironolactone/Chlorthalidone, please HOLD on the morning of the test. Patients who wear a continuous glucose monitor MUST remove the device prior to scanning. FEMALES- please wear underwire-free bra if available, avoid dresses & tight clothing         After the Test: Drink plenty of water. After receiving IV contrast, you may experience a mild flushed feeling. This is normal. On occasion, you may experience a mild rash up to 24 hours after the test. This is not dangerous. If this occurs, you can take Benadryl 25 mg, Zyrtec, Claritin, or Allegra and increase your fluid intake. (Patients taking Tikosyn should avoid Benadryl, and may take Zyrtec, Claritin, or Allegra) If you experience trouble breathing, this can be serious. If it is severe call 911 IMMEDIATELY.  If it is mild, please call our office.  We will call to schedule your test 2-4 weeks out understanding that some insurance companies will need an authorization prior to the service being performed.   For more information and frequently asked questions, please visit our  website : [email or web address]  For non-scheduling related questions, please contact the cardiac imaging nurse navigator should you have any questions/concerns: Cardiac Imaging Nurse Navigators Direct Office Dial: [phone]   For scheduling needs, including cancellations and rescheduling, please call Brittany, [phone].  For billing questions, please call [phone].    Follow-Up: At [facility], you and your health needs are our priority.  As part of our continuing mission to provide you with exceptional heart care, our providers are all part of one team.  This team includes your primary Cardiologist (physician) and Advanced Practice Providers or APPs (Physician Assistants and Nurse Practitioners) who all work together to provide you with the care you need, when you need it.  Your next appointment:    6-8 weeks Dr. Sheena or Damien Braver NP  We recommend signing up for the patient portal called MyChart.  Sign up information is provided on this After Visit Summary.  MyChart is used to connect with patients for Virtual Visits (Telemedicine).  Patients are able to view lab/test results, encounter notes, upcoming appointments, etc.  Non-urgent messages can be sent to your provider as well.   To learn more about what you can do with MyChart, go to [email or web address].   Other Instructions Monitor blood pressure. Goal BP is 130/80 or less.

## 2024-02-07 ENCOUNTER — Ambulatory Visit: Payer: Self-pay | Admitting: Nurse Practitioner

## 2024-02-11 ENCOUNTER — Ambulatory Visit: Admitting: Student

## 2024-02-11 ENCOUNTER — Encounter: Payer: Self-pay | Admitting: Student

## 2024-02-11 VITALS — BP 138/82 | HR 68 | Temp 98.0°F | Resp 16 | Ht 64.0 in | Wt 237.0 lb

## 2024-02-11 DIAGNOSIS — I10 Essential (primary) hypertension: Secondary | ICD-10-CM

## 2024-02-11 DIAGNOSIS — E559 Vitamin D deficiency, unspecified: Secondary | ICD-10-CM | POA: Insufficient documentation

## 2024-02-11 DIAGNOSIS — E785 Hyperlipidemia, unspecified: Secondary | ICD-10-CM

## 2024-02-11 DIAGNOSIS — Z0000 Encounter for general adult medical examination without abnormal findings: Secondary | ICD-10-CM

## 2024-02-11 DIAGNOSIS — R7303 Prediabetes: Secondary | ICD-10-CM

## 2024-02-11 DIAGNOSIS — G4709 Other insomnia: Secondary | ICD-10-CM | POA: Insufficient documentation

## 2024-02-11 DIAGNOSIS — D649 Anemia, unspecified: Secondary | ICD-10-CM

## 2024-02-11 DIAGNOSIS — I2510 Atherosclerotic heart disease of native coronary artery without angina pectoris: Secondary | ICD-10-CM

## 2024-02-11 LAB — TSH: TSH: 1.33 u[IU]/mL (ref 0.35–5.50)

## 2024-02-11 LAB — COMPREHENSIVE METABOLIC PANEL WITH GFR
ALT: 21 U/L (ref 3–35)
AST: 16 U/L (ref 5–37)
Albumin: 4.6 g/dL (ref 3.5–5.2)
Alkaline Phosphatase: 75 U/L (ref 39–117)
BUN: 14 mg/dL (ref 6–23)
CO2: 30 meq/L (ref 19–32)
Calcium: 9.5 mg/dL (ref 8.4–10.5)
Chloride: 106 meq/L (ref 96–112)
Creatinine, Ser: 0.62 mg/dL (ref 0.40–1.20)
GFR: 100.59 mL/min
Glucose, Bld: 105 mg/dL — ABNORMAL HIGH (ref 70–99)
Potassium: 3.9 meq/L (ref 3.5–5.1)
Sodium: 141 meq/L (ref 135–145)
Total Bilirubin: 0.5 mg/dL (ref 0.2–1.2)
Total Protein: 7.2 g/dL (ref 6.0–8.3)

## 2024-02-11 LAB — HEMOGLOBIN A1C: Hgb A1c MFr Bld: 6.8 % — ABNORMAL HIGH (ref 4.6–6.5)

## 2024-02-11 LAB — VITAMIN D 25 HYDROXY (VIT D DEFICIENCY, FRACTURES): VITD: 37.93 ng/mL (ref 30.00–100.00)

## 2024-02-11 MED ORDER — THERA VITAL M PO TABS: 1.0000 | ORAL_TABLET | Freq: Every day | ORAL | Status: AC

## 2024-02-11 MED ORDER — AMITRIPTYLINE HCL 10 MG PO TABS: 10.0000 mg | ORAL_TABLET | Freq: Every evening | ORAL | 1 refills | Status: AC | PRN

## 2024-02-11 NOTE — Assessment & Plan Note (Signed)
 Tolerating statin. Encourage heart healthy diet such as MIND or DASH diet, increase exercise, avoid trans fats, simple carbohydrates and processed foods, consider a krill or fish or flaxseed oil cap daily.

## 2024-02-11 NOTE — Assessment & Plan Note (Signed)
"  Stable   Asymptomatic   "

## 2024-02-11 NOTE — Assessment & Plan Note (Signed)
 Supplement and monitor

## 2024-02-11 NOTE — Progress Notes (Signed)
 "  Subjective:     Patient ID: Cassidy Sanchez, female    DOB: 18-Aug-1970, 54 y.o.   MRN: [id number]  Chief Complaint  Patient presents with   New Patient (Initial Visit)    Patient is a new patient. Establishing care with the provider    HPI  Discussed the use of AI scribe software for clinical note transcription with the patient, who gave verbal consent to proceed.  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  She had an MI in February 2020 with chest discomfort, sweating, and vomiting. Cardiac catheterization showed significant apical blockage treated medically and she has been managed with medications since.  Recently she has recurrent chest spasms described as a pulling sensation in the chest sometimes radiating to the neck. Nitroglycerin  relieves the pain but causes headaches. She had a similar episode at a recent cardiology visit and an EKG was done. She is scheduled for a cardiac CT and echocardiogram.  She takes atorvastatin  80 mg, verapamil , lisinopril , a blood thinner, and nitroglycerin  as needed for chest pain. She is unsure if she is supposed to be taking Zetia .  She is not aware of a formal diabetes diagnosis but is monitoring for it and is worried about her weight despite trying to eat healthily and avoiding fried foods.  She has postmenopausal hot flashes and night sweats that have improved after stopping caffeine. She had a partial hysterectomy in 2001 and still has her ovaries.  She has difficulty falling asleep and may be awake until 2 AM. Amitriptyline  previously helped but she has not used it recently. She has anxiety, especially about her heart, and notes that her cardiac symptoms can feel similar to  anxiety attacks.  She takes a multivitamin and vitamin D with K3 once a week. She has had low vitamin D and potassium levels in the past.   Patient denies fever, chills, SOB, CP, palpitations, dyspnea, edema, HA, vision changes, N/V/D, abdominal pain, urinary symptoms, rash, weight changes, and recent illness or hospitalizations.   Health Maintenance Due  Topic Date Due   HIV Screening  Never done   Hepatitis C Screening  Never done   DTaP/Tdap/Td (1 - Tdap) Never done   Pneumococcal Vaccine: 50+ Years (1 of 2 - PCV) Never done   Hepatitis B Vaccines 19-59 Average Risk (1 of 3 - 19+ 3-dose series) Never done   Zoster Vaccines- Shingrix (1 of 2) Never done   Mammogram  Never done   Colonoscopy  Never done   COVID-19 Vaccine (3 - Pfizer risk series) 12/11/2019    Past Medical History:  Diagnosis Date   Family history of anesthesia complication    mom nausea and vomited;confusion   Headache(784.0)    sinus   History of shingles    Hyperlipidemia    Hypertension    Joint swelling    right ankle   Leukocytosis 02/24/2020   PONV (postoperative nausea and vomiting)    S/P cardiac cath 02/23/20 with non obstructive disease but suggestion of vasopsasm vs potential distal SCAD 02/24/2020   Seasonal allergies    takes Zyrtec D as needed   SVT (supraventricular tachycardia)    takes Lopressor  daily    Past Surgical  History:  Procedure Laterality Date   ANKLE SURGERY Right    CESAREAN SECTION     CHOLECYSTECTOMY N/A 04/13/2013   Procedure: LAPAROSCOPIC CHOLECYSTECTOMY;  Surgeon: Camellia CHRISTELLA Blush, MD;  Location: [facility] OR;  Service: General;  Laterality: N/A;   CHOLECYSTECTOMY     KNEE ARTHROSCOPY Right 12/05/2020   Procedure: RIGHT KNEE ARTHROSCOPY, DEBRIDEMENT;  Surgeon: Addie Cordella Hamilton, MD;  Location: [facility] OR;  Service: Orthopedics;  Laterality: Right;   LEFT HEART CATH AND CORONARY ANGIOGRAPHY N/A 02/23/2020   Procedure: LEFT HEART CATH AND CORONARY ANGIOGRAPHY;  Surgeon: Burnard Debby LABOR,  MD;  Location: [facility] INVASIVE CV LAB;  Service: Cardiovascular;  Laterality: N/A;   NASAL SEPTUM SURGERY     TONSILLECTOMY     TUBAL LIGATION     uvula removed     VAGINAL HYSTERECTOMY      Family History  Problem Relation Age of Onset   COPD Mother    High blood pressure Mother    Asthma Mother    Cancer Father    Lung cancer Father        died in 37s   Cancer Maternal Grandmother    Cancer Maternal Grandfather    Cancer Paternal Grandmother    Breast cancer Paternal Grandmother    Cancer Paternal Grandfather    Von Willebrand disease Daughter    Colon cancer Neg Hx    Diabetes Neg Hx     Social History   Socioeconomic History   Marital status: Divorced    Spouse name: Not on file   Number of children: 2   Years of education: Not on file   Highest education level: Bachelor's degree (e.g., BA, AB, BS)  Occupational History   Not on file  Tobacco Use   Smoking status: Never   Smokeless tobacco: Never  Vaping Use   Vaping status: Never Used  Substance and Sexual Activity   Alcohol use: No   Drug use: No   Sexual activity: Yes    Birth control/protection: Surgical  Other Topics Concern   Not on file  Social History Narrative   Not on file   Social Drivers of Health   Tobacco Use: Low Risk (02/11/2024)   Patient History    Smoking Tobacco Use: Never    Smokeless Tobacco Use: Never    Passive Exposure: Not on file  Financial Resource Strain: Low Risk (02/10/2024)   Overall Financial Resource Strain (CARDIA)    Difficulty of Paying Living Expenses: Not hard at all  Food Insecurity: No Food Insecurity (02/10/2024)   Epic    Worried About Radiation Protection Practitioner of Food in the Last Year: Never true    Ran Out of Food in the Last Year: Never true  Transportation Needs: No Transportation Needs (02/10/2024)   Epic    Lack of Transportation (Medical): No    Lack of Transportation (Non-Medical): No  Physical Activity: Not on file  Stress: No Stress Concern Present (06/25/2023)    Received from [facility] of Occupational [facility] - Occupational Stress Questionnaire    Feeling of Stress : Only a little  Social Connections: Socially Isolated (02/10/2024)   Social Connection and Isolation Panel    Frequency of Communication with Friends and Family: More than three times a week    Frequency of Social Gatherings with Friends and Family: More than three times a week    Attends Religious Services: Patient declined    Database Administrator or Organizations: No  Attends Banker Meetings: Not on file    Marital Status: Divorced  Intimate Partner Violence: Not At Risk (05/24/2022)   Received from [facility]   HITS    Over the last 12 months how often did your partner physically hurt you?: Never    Over the last 12 months how often did your partner insult you or talk down to you?: Never    Over the last 12 months how often did your partner threaten you with physical harm?: Never    Over the last 12 months how often did your partner scream or curse at you?: Never  Depression (PHQ2-9): Low Risk (02/11/2024)   Depression (PHQ2-9)    PHQ-2 Score: 2  Alcohol Screen: Low Risk (02/10/2024)   Alcohol Screen    Last Alcohol Screening Score (AUDIT): 1  Housing: Unknown (02/10/2024)   Epic    Unable to Pay for Housing in the Last Year: No    Number of Times Moved in the Last Year: Not on file    Homeless in the Last Year: No  Utilities: Not At Risk (06/25/2023)   Received from [facility] Utilities    Threatened with loss of utilities: No  Health Literacy: Not on file    Outpatient Medications Prior to Visit  Medication Sig Dispense Refill   acetaminophen  (TYLENOL ) 500 MG tablet Take 1,000 mg by mouth every 6 (six) hours as needed for headache or mild pain.     atorvastatin  (LIPITOR ) 80 MG tablet TAKE 1 TABLET BY MOUTH EVERY DAY 90 tablet 3   clopidogrel  (PLAVIX ) 75 MG tablet TAKE 1 TABLET BY MOUTH EVERY DAY 90 tablet 3   isosorbide   mononitrate (IMDUR ) 30 MG 24 hr tablet TAKE 10.5 TABLETS (315 MG TOTAL) BY MOUTH DAILY. 45 tablet 3   lisinopril  (ZESTRIL ) 20 MG tablet Take 1 tablet (20 mg total) by mouth daily. 90 tablet 3   metoprolol  tartrate (LOPRESSOR ) 100 MG tablet Take 1 tablet 2 hours prior to cardiac ct 1 tablet 0   nitroGLYCERIN  (NITROSTAT ) 0.4 MG SL tablet Place 1 tablet (0.4 mg total) under the tongue every 5 (five) minutes as needed for chest pain. 25 tablet 4   valACYclovir (VALTREX) 500 MG tablet Take 500 mg by mouth 3 (three) times daily as needed (flare-up).     verapamil  (CALAN -SR) 240 MG CR tablet Take 1 tablet (240 mg total) by mouth daily. 30 tablet 0   amitriptyline  (ELAVIL ) 10 MG tablet Take 10 mg by mouth at bedtime as needed for sleep.     ezetimibe  (ZETIA ) 10 MG tablet Take 1 tablet (10 mg total) by mouth daily. 30 tablet 3   fluticasone  (FLONASE ) 50 MCG/ACT nasal spray Place 2 sprays into both nostrils daily. 9.9 mL 0   No facility-administered medications prior to visit.    Allergies[1]  ROS    See HPI Objective:    Physical Exam Vitals reviewed.  Constitutional:      General: She is not in acute distress.    Appearance: She is obese. She is not toxic-appearing.  HENT:     Head: Normocephalic and atraumatic.     Mouth/Throat:     Mouth: Mucous membranes are moist.     Pharynx: Oropharynx is clear.  Eyes:     Pupils: Pupils are equal, round, and reactive to light.  Cardiovascular:     Rate and Rhythm: Normal rate and regular rhythm.     Pulses: Normal pulses.  Heart sounds: Normal heart sounds. No murmur heard. Pulmonary:     Effort: Pulmonary effort is normal. No respiratory distress.     Breath sounds: Normal breath sounds. No wheezing.  Musculoskeletal:        General: No swelling.     Cervical back: Neck supple.  Skin:    General: Skin is warm and dry.  Neurological:     General: No focal deficit present.     Mental Status: She is alert and oriented to person, place,  and time.  Psychiatric:        Mood and Affect: Mood normal.        Behavior: Behavior normal.        Thought Content: Thought content normal.        Judgment: Judgment normal.      BP 138/82 (BP Location: Left Arm, Patient Position: Sitting, Cuff Size: Normal)   Pulse 68   Temp 98 F (36.7 C) (Oral)   Resp 16   Ht 5' 4 (1.626 m)   Wt 237 lb (107.5 kg)   SpO2 97%   BMI 40.68 kg/m  Wt Readings from Last 3 Encounters:  02/11/24 237 lb (107.5 kg)  02/02/24 237 lb (107.5 kg)  11/22/23 232 lb (105.2 kg)       Assessment & Plan:   Problem List Items Addressed This Visit       Cardiovascular and Mediastinum   CAD in native artery - Primary   Stable. Asymptomatic.      Essential hypertension   Well controlled, no changes to meds. Encouraged heart healthy diet such as the DASH diet and exercise as tolerated.        Relevant Orders   TSH     Other   ANEMIA   Stable. Asymptomatic.      Hyperlipemia   Tolerating statin.Encourage heart healthy diet such as MIND or DASH diet, increase exercise, avoid trans fats, simple carbohydrates and processed foods, consider a krill or fish or flaxseed oil cap daily.        Relevant Orders   Comprehensive metabolic panel with GFR   Other insomnia   Difficulty initiating sleep, possibly related to anxiety. Amitriptyline  previously effective. - Rx- amitriptyline  10 mg HS as needed for sleep      Relevant Medications   amitriptyline  (ELAVIL ) 10 MG tablet   Vitamin D deficiency   Supplement and monitor      Relevant Orders   Vitamin D (25 hydroxy)   Other Visit Diagnoses       Prediabetes       Relevant Orders   HgB A1c     Preventative health care       Relevant Orders   HIV Antibody (routine testing w rflx)   Hepatitis B surface antibody,quantitative      Recurrent herpes simplex virus infection Recurrent flare-ups approximately once a month, often associated with stress  - Continue Valtrex as needed.  General  health maintenance Routine health maintenance discussed, including vitamin D importance post-menopause. - Continue multivitamin. - Schedule follow-up in three months.  FU 3 months   I have discontinued Cassidy HERO. Tiso's ezetimibe  and fluticasone . I have also changed her amitriptyline . Additionally, I am having her start on multivitamin. Lastly, I am having her maintain her acetaminophen , valACYclovir, nitroGLYCERIN , lisinopril , atorvastatin , clopidogrel , verapamil , metoprolol  tartrate, and isosorbide  mononitrate.  Meds ordered this encounter  Medications   amitriptyline  (ELAVIL ) 10 MG tablet    Sig: Take 1 tablet (10 mg total)  by mouth at bedtime as needed for sleep.    Dispense:  30 tablet    Refill:  1    Supervising Provider:   DOMENICA BLACKBIRD A [[id number]]   Multiple Vitamins-Minerals (MULTIVITAMIN) tablet    Sig: Take 1 tablet by mouth daily.    Supervising Provider:   DOMENICA BLACKBIRD A [[id number]]      [1]  Allergies Allergen Reactions   Latex Hives and Itching   Erythromycin Hives   Sulfa Antibiotics Itching   Adhesive [Tape] Rash    Paper tape ok.    Codeine Hives and Itching   "

## 2024-02-11 NOTE — Assessment & Plan Note (Signed)
 Well controlled, no changes to meds. Encouraged heart healthy diet such as the DASH diet and exercise as tolerated.

## 2024-02-11 NOTE — Assessment & Plan Note (Signed)
 Difficulty initiating sleep, possibly related to anxiety. Amitriptyline  previously effective. - Rx- amitriptyline  10 mg HS as needed for sleep

## 2024-02-22 ENCOUNTER — Ambulatory Visit (HOSPITAL_BASED_OUTPATIENT_CLINIC_OR_DEPARTMENT_OTHER)

## 2024-02-29 ENCOUNTER — Ambulatory Visit (HOSPITAL_COMMUNITY)

## 2024-03-30 ENCOUNTER — Ambulatory Visit: Admitting: Cardiology

## 2024-04-14 ENCOUNTER — Ambulatory Visit: Admitting: Student

## 2024-05-10 ENCOUNTER — Encounter: Admitting: Student
